# Patient Record
Sex: Male | Born: 1945 | Race: White | Hispanic: No | Marital: Single | Smoking: Current every day smoker
Health system: Southern US, Community
[De-identification: ages and names within clinical notes are randomized; demographics above are authoritative.]

## PROBLEM LIST (undated history)

## (undated) DIAGNOSIS — E7800 Pure hypercholesterolemia, unspecified: Secondary | ICD-10-CM

## (undated) DIAGNOSIS — J449 Chronic obstructive pulmonary disease, unspecified: Secondary | ICD-10-CM

## (undated) DIAGNOSIS — I10 Essential (primary) hypertension: Secondary | ICD-10-CM

## (undated) DIAGNOSIS — M549 Dorsalgia, unspecified: Secondary | ICD-10-CM

## (undated) HISTORY — PX: CATARACT EXTRACTION: SUR2

## (undated) HISTORY — PX: ROTATOR CUFF REPAIR: SHX139

---

## 2011-05-02 ENCOUNTER — Emergency Department (HOSPITAL_COMMUNITY): Payer: Self-pay

## 2011-05-02 ENCOUNTER — Emergency Department (HOSPITAL_COMMUNITY)
Admission: EM | Admit: 2011-05-02 | Discharge: 2011-05-02 | Disposition: A | Discharge status: Home / Self Care | Payer: Self-pay | Attending: Emergency Medicine | Admitting: Emergency Medicine

## 2011-05-02 ENCOUNTER — Encounter: Payer: Self-pay | Admitting: *Deleted

## 2011-05-02 ENCOUNTER — Other Ambulatory Visit: Payer: Self-pay

## 2011-05-02 DIAGNOSIS — F17200 Nicotine dependence, unspecified, uncomplicated: Secondary | ICD-10-CM | POA: Insufficient documentation

## 2011-05-02 DIAGNOSIS — R0789 Other chest pain: Secondary | ICD-10-CM | POA: Insufficient documentation

## 2011-05-02 DIAGNOSIS — E785 Hyperlipidemia, unspecified: Secondary | ICD-10-CM | POA: Insufficient documentation

## 2011-05-02 DIAGNOSIS — Z79899 Other long term (current) drug therapy: Secondary | ICD-10-CM | POA: Insufficient documentation

## 2011-05-02 DIAGNOSIS — R911 Solitary pulmonary nodule: Secondary | ICD-10-CM

## 2011-05-02 DIAGNOSIS — E789 Disorder of lipoprotein metabolism, unspecified: Secondary | ICD-10-CM | POA: Insufficient documentation

## 2011-05-02 DIAGNOSIS — R799 Abnormal finding of blood chemistry, unspecified: Secondary | ICD-10-CM | POA: Insufficient documentation

## 2011-05-02 DIAGNOSIS — R091 Pleurisy: Secondary | ICD-10-CM | POA: Insufficient documentation

## 2011-05-02 DIAGNOSIS — R059 Cough, unspecified: Secondary | ICD-10-CM | POA: Insufficient documentation

## 2011-05-02 DIAGNOSIS — I10 Essential (primary) hypertension: Secondary | ICD-10-CM | POA: Insufficient documentation

## 2011-05-02 DIAGNOSIS — R0602 Shortness of breath: Secondary | ICD-10-CM | POA: Insufficient documentation

## 2011-05-02 DIAGNOSIS — R05 Cough: Secondary | ICD-10-CM | POA: Insufficient documentation

## 2011-05-02 DIAGNOSIS — J984 Other disorders of lung: Secondary | ICD-10-CM | POA: Insufficient documentation

## 2011-05-02 HISTORY — DX: Essential (primary) hypertension: I10

## 2011-05-02 HISTORY — DX: Pure hypercholesterolemia, unspecified: E78.00

## 2011-05-02 LAB — COMPREHENSIVE METABOLIC PANEL
ALT: 15 U/L (ref 0–53)
AST: 16 U/L (ref 0–37)
Albumin: 3.7 g/dL (ref 3.5–5.2)
Alkaline Phosphatase: 60 U/L (ref 39–117)
CO2: 29 mEq/L (ref 19–32)
Chloride: 103 mEq/L (ref 96–112)
GFR calc non Af Amer: 86 mL/min — ABNORMAL LOW (ref >90)
Potassium: 4.7 mEq/L (ref 3.5–5.1)
Total Bilirubin: 0.4 mg/dL (ref 0.3–1.2)

## 2011-05-02 LAB — DIFFERENTIAL
Basophils Absolute: 0.1 10*3/uL (ref 0.0–0.1)
Basophils Relative: 1 % (ref 0–1)
Lymphocytes Relative: 20 % (ref 12–46)
Monocytes Relative: 9 % (ref 3–12)
Neutro Abs: 5.1 10*3/uL (ref 1.7–7.7)
Neutrophils Relative %: 64 % (ref 43–77)

## 2011-05-02 LAB — CARDIAC PANEL(CRET KIN+CKTOT+MB+TROPI): Relative Index: INVALID (ref 0.0–2.5)

## 2011-05-02 LAB — CBC
Hemoglobin: 14 g/dL (ref 13.0–17.0)
MCHC: 34.4 g/dL (ref 30.0–36.0)
RDW: 12.9 % (ref 11.5–15.5)
WBC: 8 10*3/uL (ref 4.0–10.5)

## 2011-05-02 MED ORDER — OXYCODONE-ACETAMINOPHEN 5-325 MG PO TABS: 1.0000 | ORAL_TABLET | Freq: Four times a day (QID) | ORAL | Status: AC | PRN

## 2011-05-02 MED ORDER — IOHEXOL 300 MG/ML  SOLN
100.0000 mL | Freq: Once | INTRAMUSCULAR | Status: AC | PRN
  Administered 2011-05-02: 100 mL via INTRAVENOUS

## 2011-05-02 MED ORDER — OXYCODONE-ACETAMINOPHEN 5-325 MG PO TABS
1.0000 | ORAL_TABLET | Freq: Once | ORAL | Status: AC
  Administered 2011-05-02: 1 via ORAL
  Filled 2011-05-02: qty 1

## 2011-05-02 NOTE — ED Notes (Signed)
Patient transported to CT 

## 2011-05-02 NOTE — ED Provider Notes (Addendum)
History     CSN: [id number]  Arrival date & time 05/02/11  1057   First MD Initiated Contact with Patient 05/02/11 1212      Chief Complaint  Patient presents with  . Chest Pain  . Cough    (Consider location/radiation/quality/duration/timing/severity/associated sxs/prior treatment) Patient is a 65 y.o. male presenting with chest pain and cough. The history is provided by the patient.  Chest Pain The chest pain began 3 - 5 days ago. Duration of episode(s) is 4 days. Chest pain occurs constantly. The chest pain is worsening. The pain is associated with breathing and coughing. At its most intense, the pain is at 8/10. The pain is currently at 8/10. The severity of the pain is severe. The quality of the pain is described as pleuritic, sharp, tightness and stabbing. The pain does not radiate. Chest pain is worsened by deep breathing. Primary symptoms include shortness of breath and cough. Pertinent negatives for primary symptoms include no fever, no wheezing, no palpitations, no abdominal pain, no nausea and no vomiting.  Pertinent negatives for associated symptoms include no diaphoresis, no lower extremity edema and no weakness. Associated symptoms comments: States that his appetite has been decreased over the last few days. He tried narcotics (He had some Tylenol 3 that he took which helped the pain some) for the symptoms. Risk factors include male gender and smoking/tobacco exposure.  His past medical history is significant for hyperlipidemia and hypertension.  Pertinent negatives for past medical history include no CAD and no diabetes.  Procedure history is negative for stress echo.    Cough Associated symptoms include chest pain and shortness of breath. Pertinent negatives include no wheezing.    Past Medical History  Diagnosis Date  . Hypertension   . High cholesterol     History reviewed. No pertinent past surgical history.  History reviewed. No pertinent family  history.  History  Substance Use Topics  . Smoking status: Current Everyday Smoker    Types: Cigarettes  . Smokeless tobacco: Not on file  . Alcohol Use: No      Review of Systems  Constitutional: Negative for fever and diaphoresis.  Respiratory: Positive for cough and shortness of breath. Negative for wheezing.   Cardiovascular: Positive for chest pain. Negative for palpitations.  Gastrointestinal: Negative for nausea, vomiting and abdominal pain.  Neurological: Negative for weakness.  All other systems reviewed and are negative.    Allergies  Review of patient's allergies indicates no known allergies.  Home Medications   Current Outpatient Rx  Name Route Sig Dispense Refill  . ACETAMINOPHEN-CODEINE #3 300-30 MG PO TABS Oral Take 2 tablets by mouth 3 (three) times daily as needed. For pain.     Marland Kitchen CARVEDILOL 25 MG PO TABS Oral Take 25 mg by mouth 2 (two) times daily with a meal.      . GABAPENTIN 400 MG PO CAPS Oral Take 400 mg by mouth 3 (three) times daily.      Marland Kitchen PSEUDOEPHEDRINE HCL 60 MG PO TABS Oral Take 60 mg by mouth every 6 (six) hours as needed. For sinus pressure.     Marland Kitchen SIMVASTATIN 40 MG PO TABS Oral Take 40 mg by mouth at bedtime.      . TRAZODONE HCL 100 MG PO TABS Oral Take 150 mg by mouth at bedtime.        BP 167/73  Pulse 60  Temp(Src) 98.1 F (36.7 C) (Oral)  Resp 16  SpO2 100%  Physical Exam  Nursing  note and vitals reviewed. Constitutional: He is oriented to person, place, and time. He appears well-developed and well-nourished. No distress.  HENT:  Head: Normocephalic and atraumatic.  Mouth/Throat: Oropharynx is clear and moist.  Eyes: Conjunctivae and EOM are normal. Pupils are equal, round, and reactive to light.  Neck: Normal range of motion. Neck supple.  Cardiovascular: Normal rate, regular rhythm and intact distal pulses.   No murmur heard. Pulmonary/Chest: Effort normal and breath sounds normal. Not tachypneic. No respiratory distress.  He has no decreased breath sounds. He has no wheezes. He has no rales. He exhibits tenderness and bony tenderness. He exhibits no crepitus, no deformity and no swelling.    Abdominal: Soft. He exhibits no distension. There is no tenderness. There is no rebound and no guarding.  Musculoskeletal: Normal range of motion. He exhibits no edema and no tenderness.  Neurological: He is alert and oriented to person, place, and time.  Skin: Skin is warm and dry. No rash noted. No erythema.  Psychiatric: He has a normal mood and affect. His behavior is normal.    ED Course  Procedures (including critical care time)  Labs Reviewed  CBC - Abnormal; Notable for the following:    RBC 4.20 (*)    All other components within normal limits  DIFFERENTIAL - Abnormal; Notable for the following:    Eosinophils Relative 6 (*)    All other components within normal limits  COMPREHENSIVE METABOLIC PANEL - Abnormal; Notable for the following:    GFR calc non Af Amer 86 (*)    All other components within normal limits  D-DIMER, QUANTITATIVE - Abnormal; Notable for the following:    D-Dimer, Quant 0.84 (*)    All other components within normal limits  CARDIAC PANEL(CRET KIN+CKTOT+MB+TROPI)   No results found.   Date: 05/02/2011  Rate: 56  Rhythm: sinus bradycardia  QRS Axis: normal  Intervals: normal  ST/T Wave abnormalities: early repolarization  Conduction Disutrbances:none  Narrative Interpretation:   Old EKG Reviewed: none available    No diagnosis found.    MDM   Pt with atypical story for CP.  Symptoms are mostly pleuritic and they started before the cough. Denies any infectious symptoms other than her cough.  He has no known cardiac history but does have a history of hypertension and hyperlipidemia.  TIMI 1 for risk factors.  Low risk well's.  Concern for possible spontaneous pneumothorax versus infectious etiology versus cardiac versus PE. EKG sinus bradycardia.  CXR, CBC, CMP, CE,  d-dimer pending. 1:28 PM Labs all within normal limits except for an elevated d-dimer. Chest x-ray within normal limits. However due to the elevated d-dimer we will get a CT of the chest to further evaluate. A very low likelihood that this is cardiac especially given negative cardiac markers and symptoms for over 48 hours. Move to CDU for CT.  4:24 PM CT negative for PE. Pulmonary nodule is seen and patient is a smoker so discussed with him followup for repeat imaging in 6-12 months. Discussed results with patient may be pleurisy versus unknown cause for pain however do not feel that it is cardiac or respiratory in nature. Will discharge and have him follow up at the [place] on Wednesday as planned.      Gwyneth Sprout, MD 05/02/11 1329  Gwyneth Sprout, MD 05/02/11 [phone]

## 2011-05-02 NOTE — ED Notes (Signed)
Reports left side stabbing constant chest pains since Friday. Increases with breathing and having productive cough. No distress noted at triage, ekg done.

## 2015-01-09 ENCOUNTER — Encounter: Payer: Self-pay | Admitting: Emergency Medicine

## 2015-01-09 ENCOUNTER — Emergency Department
Admission: EM | Admit: 2015-01-09 | Discharge: 2015-01-09 | Disposition: A | Discharge status: Home / Self Care | Payer: Medicare Other | Attending: Emergency Medicine | Admitting: Emergency Medicine

## 2015-01-09 DIAGNOSIS — M5432 Sciatica, left side: Secondary | ICD-10-CM

## 2015-01-09 DIAGNOSIS — M5442 Lumbago with sciatica, left side: Secondary | ICD-10-CM | POA: Insufficient documentation

## 2015-01-09 DIAGNOSIS — Z791 Long term (current) use of non-steroidal anti-inflammatories (NSAID): Secondary | ICD-10-CM | POA: Insufficient documentation

## 2015-01-09 DIAGNOSIS — G8929 Other chronic pain: Secondary | ICD-10-CM | POA: Insufficient documentation

## 2015-01-09 DIAGNOSIS — R202 Paresthesia of skin: Secondary | ICD-10-CM | POA: Insufficient documentation

## 2015-01-09 DIAGNOSIS — Z720 Tobacco use: Secondary | ICD-10-CM | POA: Diagnosis not present

## 2015-01-09 DIAGNOSIS — M545 Low back pain: Secondary | ICD-10-CM

## 2015-01-09 DIAGNOSIS — Z7951 Long term (current) use of inhaled steroids: Secondary | ICD-10-CM | POA: Insufficient documentation

## 2015-01-09 DIAGNOSIS — Z79899 Other long term (current) drug therapy: Secondary | ICD-10-CM | POA: Diagnosis not present

## 2015-01-09 DIAGNOSIS — I10 Essential (primary) hypertension: Secondary | ICD-10-CM | POA: Insufficient documentation

## 2015-01-09 HISTORY — DX: Dorsalgia, unspecified: M54.9

## 2015-01-09 HISTORY — DX: Chronic obstructive pulmonary disease, unspecified: J44.9

## 2015-01-09 HISTORY — DX: Pure hypercholesterolemia, unspecified: E78.00

## 2015-01-09 MED ORDER — HYDROCODONE-ACETAMINOPHEN 5-325 MG PO TABS: 1.0000 | ORAL_TABLET | ORAL | Status: DC | PRN

## 2015-01-09 MED ORDER — PREDNISONE 10 MG PO TABS: ORAL_TABLET | ORAL | Status: DC

## 2015-01-09 NOTE — ED Notes (Signed)
Lower back pain with pain radiating into left leg since tuesday

## 2015-01-09 NOTE — ED Provider Notes (Signed)
[facility] Emergency Department Provider Note  ____________________________________________  Time seen: Approximately 7:41 AM  I have reviewed the triage vital signs and the nursing notes.   HISTORY  Chief Complaint Back Pain   HPI Ross Fisher is a 69 y.o. male is here today with complaint of back pain with radiation into his left leg for approximately 3 days. He states he has had a history of chronic back pain is being seen at the [facility]. He states that he was seen at the [facility] emergency room last month and has an MRI scheduled for the end of this month. He denies any urinary or bowel incontinence. He states that while in the emergency room at the [facility] last month he was given Vicodin for pain. Currently his pain is 8/10. Movement increases his pain and nothing seems to help. He denies any history of urinary tract infections or kidney stones   Past Medical History  Diagnosis Date  . Hypertension   . Back pain   . COPD (chronic obstructive pulmonary disease)   . Hypercholesteremia     There are no active problems to display for this patient.   Past Surgical History  Procedure Laterality Date  . Cataract extraction    . Rotator cuff repair      bilat    Current Outpatient Rx  Name  Route  Sig  Dispense  Refill  . albuterol (PROVENTIL HFA;VENTOLIN HFA) 108 (90 BASE) MCG/ACT inhaler   Inhalation   Inhale into the lungs every 6 (six) hours as needed for wheezing or shortness of breath.         Marland Kitchen amLODipine (NORVASC) 10 MG tablet   Oral   Take 10 mg by mouth daily.         . budesonide-formoterol (SYMBICORT) 160-4.5 MCG/ACT inhaler   Inhalation   Inhale 2 puffs into the lungs 2 (two) times daily.         . carvedilol (COREG) 12.5 MG tablet   Oral   Take 12.5 mg by mouth 2 (two) times daily with a meal.         . cetirizine (ZYRTEC) 10 MG tablet   Oral   Take 10 mg by mouth daily.         Marland Kitchen gabapentin (NEURONTIN) 400 MG  capsule   Oral   Take 400 mg by mouth 3 (three) times daily.         . methocarbamol (ROBAXIN) 500 MG tablet   Oral   Take 500 mg by mouth 4 (four) times daily.         . naproxen (NAPROSYN) 250 MG tablet   Oral   Take by mouth 2 (two) times daily with a meal.         . simvastatin (ZOCOR) 20 MG tablet   Oral   Take 20 mg by mouth daily.         . traZODone (DESYREL) 150 MG tablet   Oral   Take by mouth at bedtime.         Marland Kitchen HYDROcodone-acetaminophen (NORCO/VICODIN) 5-325 MG per tablet   Oral   Take 1 tablet by mouth every 4 (four) hours as needed for moderate pain.   20 tablet   0   . predniSONE (DELTASONE) 10 MG tablet      Take 6 tablets  today, on day 2 take 5 tablets, day 3 take 4 tablets, day 4 take 3 tablets, day 5 take  2 tablets  and 1 tablet the last day   21 tablet   0     Allergies Review of patient's allergies indicates no known allergies.  History reviewed. No pertinent family history.  Social History Social History  Substance Use Topics  . Smoking status: Current Every Day Smoker  . Smokeless tobacco: None  . Alcohol Use: No    Review of Systems Constitutional: No fever/chills Eyes: No visual changes. ENT: No sore throat. Cardiovascular: Denies chest pain. Respiratory: Denies shortness of breath. Gastrointestinal: No abdominal pain.  No nausea, no vomiting.   Genitourinary: Negative for dysuria. Musculoskeletal: Positive for chronic back pain Skin: Negative for rash. Neurological: Negative for headaches, focal weakness. Left leg paresthesias  10-point ROS otherwise negative.  ____________________________________________   PHYSICAL EXAM:  VITAL SIGNS: ED Triage Vitals  Enc Vitals Group     BP --      Pulse --      Resp --      Temp --      Temp src --      SpO2 --      Weight --      Height --      Head Cir --      Peak Flow --      Pain Score 01/09/15 0726 8     Pain Loc --      Pain Edu? --      Excl. in GC? --      Constitutional: Alert and oriented. Well appearing and in no acute distress. Eyes: Conjunctivae are normal. PERRL. EOMI. Head: Atraumatic. Nose: No congestion/rhinnorhea. Neck: No stridor.   Cardiovascular: Normal rate, regular rhythm. Grossly normal heart sounds.  Good peripheral circulation. Respiratory: Normal respiratory effort.  No retractions. Lungs CTAB. Gastrointestinal: Soft and nontender. No distention,  bowel sounds normoactive 4 quadrants Musculoskeletal: Back exam no deformity was noted. There is moderate tenderness on palpation of the left SI joint area which reproduces patient's pain. A leg raises at 90 with discomfort. She is ambulatory with a cane. No lower extremity tenderness nor edema.  No joint effusions. Neurologic:  Normal speech and language. No gross focal neurologic deficits are appreciated. No gait instability. Reflexes are 2+ bilaterally Skin:  Skin is warm, dry and intact. No rash noted. Psychiatric: Mood and affect are normal. Speech and behavior are normal.  ____________________________________________   LABS (all labs ordered are listed, but only abnormal results are displayed)  Labs Reviewed - No data to display  PROCEDURES  Procedure(s) performed: None  Critical Care performed: No  ____________________________________________   INITIAL IMPRESSION / ASSESSMENT AND PLAN / ED COURSE  Pertinent labs & imaging results that were available during my care of the patient were reviewed by me and considered in my medical decision making (see chart for details).  Patient drove himself to the emergency room today therefore he was not given any medication while in the emergency room. He was started on a prednisone taper and also given a limited quantity of hydrocodone No. 20. Patient is to keep his appointment for his MRI ____________________________________________   FINAL CLINICAL IMPRESSION(S) / ED DIAGNOSES  Final diagnoses:  Sciatica, left   Chronic low back pain      Tommi Rumps, PA-C 01/09/15 1610  Sharyn Creamer, MD 01/10/15 [phone]

## 2015-01-09 NOTE — ED Notes (Signed)
Reports chronic back pain, worse since Tuesday.  Skin w/d, MAE

## 2015-01-12 ENCOUNTER — Encounter: Payer: Self-pay | Admitting: *Deleted

## 2015-02-16 ENCOUNTER — Emergency Department
Admission: EM | Admit: 2015-02-16 | Discharge: 2015-02-16 | Disposition: A | Discharge status: Home / Self Care | Payer: Medicare Other | Attending: Emergency Medicine | Admitting: Emergency Medicine

## 2015-02-16 ENCOUNTER — Encounter: Payer: Self-pay | Admitting: Emergency Medicine

## 2015-02-16 DIAGNOSIS — Z79899 Other long term (current) drug therapy: Secondary | ICD-10-CM | POA: Insufficient documentation

## 2015-02-16 DIAGNOSIS — M5432 Sciatica, left side: Secondary | ICD-10-CM | POA: Diagnosis not present

## 2015-02-16 DIAGNOSIS — I10 Essential (primary) hypertension: Secondary | ICD-10-CM | POA: Insufficient documentation

## 2015-02-16 DIAGNOSIS — Z720 Tobacco use: Secondary | ICD-10-CM | POA: Insufficient documentation

## 2015-02-16 DIAGNOSIS — Z7951 Long term (current) use of inhaled steroids: Secondary | ICD-10-CM | POA: Insufficient documentation

## 2015-02-16 DIAGNOSIS — Z791 Long term (current) use of non-steroidal anti-inflammatories (NSAID): Secondary | ICD-10-CM | POA: Insufficient documentation

## 2015-02-16 DIAGNOSIS — M545 Low back pain: Secondary | ICD-10-CM | POA: Diagnosis present

## 2015-02-16 MED ORDER — HYDROCODONE-ACETAMINOPHEN 5-325 MG PO TABS: 1.0000 | ORAL_TABLET | ORAL | Status: DC | PRN

## 2015-02-16 MED ORDER — PREDNISONE 10 MG PO TABS: 10.0000 mg | ORAL_TABLET | ORAL | Status: DC

## 2015-02-16 NOTE — ED Notes (Addendum)
Having lower back pain  Denies any injury was seen for same about 6 weeks ago.Marland Kitchen.ambulates slowly to room. Was placed on prednisone taper .  felt relief after taking the meds. states pain started to return about 2-3 weeks ago

## 2015-02-16 NOTE — ED Provider Notes (Signed)
[facility] Emergency Department Provider Note  ____________________________________________  Time seen: Approximately 6:05 PM  I have reviewed the triage vital signs and the nursing notes.   HISTORY  Chief Complaint Tailbone Pain    HPI Ross Fisher is a 69 y.o. male who presents to the emergency department complaining of lower back pain radiating into his left leg. He states that he was seen for same symptoms approximately 6-7 weeks ago was diagnosed with sciatica. He is placed on prednisone taper for same and states that he had good relief from symptoms. He reports that symptoms have started a week ago and then increase last night. He has had an MRI of his lower back through the [place] system states that he does not have results yet. He endorses the pain as a fiery sensation, constant, worse with movement.   Past Medical History  Diagnosis Date  . High cholesterol   . Hypertension   . Back pain   . COPD (chronic obstructive pulmonary disease) (HCC)   . Hypercholesteremia     There are no active problems to display for this patient.   Past Surgical History  Procedure Laterality Date  . Cataract extraction    . Rotator cuff repair      bilat    Current Outpatient Rx  Name  Route  Sig  Dispense  Refill  . acetaminophen-codeine (TYLENOL #3) 300-30 MG per tablet   Oral   Take 2 tablets by mouth 3 (three) times daily as needed. For pain.          Marland Kitchen albuterol (PROVENTIL HFA;VENTOLIN HFA) 108 (90 BASE) MCG/ACT inhaler   Inhalation   Inhale into the lungs every 6 (six) hours as needed for wheezing or shortness of breath.         Marland Kitchen amLODipine (NORVASC) 10 MG tablet   Oral   Take 10 mg by mouth daily.         . budesonide-formoterol (SYMBICORT) 160-4.5 MCG/ACT inhaler   Inhalation   Inhale 2 puffs into the lungs 2 (two) times daily.         . carvedilol (COREG) 12.5 MG tablet   Oral   Take 12.5 mg by mouth 2 (two) times daily with a  meal.         . carvedilol (COREG) 25 MG tablet   Oral   Take 25 mg by mouth 2 (two) times daily with a meal.           . cetirizine (ZYRTEC) 10 MG tablet   Oral   Take 10 mg by mouth daily.         Marland Kitchen gabapentin (NEURONTIN) 400 MG capsule   Oral   Take 400 mg by mouth 3 (three) times daily.           Marland Kitchen gabapentin (NEURONTIN) 400 MG capsule   Oral   Take 400 mg by mouth 3 (three) times daily.         Marland Kitchen HYDROcodone-acetaminophen (NORCO/VICODIN) 5-325 MG tablet   Oral   Take 1 tablet by mouth every 4 (four) hours as needed for moderate pain.   20 tablet   0   . methocarbamol (ROBAXIN) 500 MG tablet   Oral   Take 500 mg by mouth 4 (four) times daily.         . naproxen (NAPROSYN) 250 MG tablet   Oral   Take by mouth 2 (two) times daily with a meal.         .  predniSONE (DELTASONE) 10 MG tablet   Oral   Take 1 tablet (10 mg total) by mouth as directed.   21 tablet   0     Take on a daily basis of 6, 5, 4, 3, 2, 1   . pseudoephedrine (SUDAFED) 60 MG tablet   Oral   Take 60 mg by mouth every 6 (six) hours as needed. For sinus pressure.          . simvastatin (ZOCOR) 20 MG tablet   Oral   Take 20 mg by mouth daily.         . simvastatin (ZOCOR) 40 MG tablet   Oral   Take 40 mg by mouth at bedtime.           . traZODone (DESYREL) 100 MG tablet   Oral   Take 150 mg by mouth at bedtime.           . traZODone (DESYREL) 150 MG tablet   Oral   Take by mouth at bedtime.           Allergies Review of patient's allergies indicates no known allergies.  No family history on file.  Social History Social History  Substance Use Topics  . Smoking status: Current Every Day Smoker    Types: Cigarettes  . Smokeless tobacco: None  . Alcohol Use: No    Review of Systems Constitutional: No fever/chills Eyes: No visual changes. ENT: No sore throat. Cardiovascular: Denies chest pain. Respiratory: Denies shortness of breath. Gastrointestinal:  No abdominal pain.  No nausea, no vomiting.  No diarrhea.  No constipation. Genitourinary: Negative for dysuria. Musculoskeletal: Endorses low back pain. Skin: Negative for rash. Neurological: Negative for headaches, focal weakness or numbness.  10-point ROS otherwise negative.  ____________________________________________   PHYSICAL EXAM:  VITAL SIGNS: ED Triage Vitals  Enc Vitals Group     BP 02/16/15 1612 127/65 mmHg     Pulse Rate 02/16/15 1612 66     Resp 02/16/15 1612 8     Temp 02/16/15 1612 98.3 F (36.8 C)     Temp Source 02/16/15 1612 Oral     SpO2 02/16/15 1612 99 %     Weight 02/16/15 1612 145 lb (65.772 kg)     Height 02/16/15 1612 5\' 8"  (1.727 m)     Head Cir --      Peak Flow --      Pain Score 02/16/15 1612 8     Pain Loc --      Pain Edu? --      Excl. in GC? --     Constitutional: Alert and oriented. Well appearing and in no acute distress. Eyes: Conjunctivae are normal. PERRL. EOMI. Head: Atraumatic. Nose: No congestion/rhinnorhea. Mouth/Throat: Mucous membranes are moist.  Oropharynx non-erythematous. Neck: No stridor.   Cardiovascular: Normal rate, regular rhythm. Grossly normal heart sounds.  Good peripheral circulation. Respiratory: Normal respiratory effort.  No retractions. Lungs CTAB. Gastrointestinal: Soft and nontender. No distention. No abdominal bruits. No CVA tenderness. Musculoskeletal: No lower extremity tenderness nor edema.  No joint effusions. Tenderness to palpation over left lateral back over the sciatic notch. Straight leg raise is positive on the left side. Sensation and pulses intact distally. No findings on right side. Neurologic:  Normal speech and language. No gross focal neurologic deficits are appreciated. No gait instability. Skin:  Skin is warm, dry and intact. No rash noted. Psychiatric: Mood and affect are normal. Speech and behavior are normal.  ____________________________________________   LABS (  all labs ordered  are listed, but only abnormal results are displayed)  Labs Reviewed - No data to display ____________________________________________  EKG   ____________________________________________  RADIOLOGY   ____________________________________________   PROCEDURES  Procedure(s) performed: None  Critical Care performed: No  ____________________________________________   INITIAL IMPRESSION / ASSESSMENT AND PLAN / ED COURSE  Pertinent labs & imaging results that were available during my care of the patient were reviewed by me and considered in my medical decision making (see chart for details).  The patient's history, symptoms, physical exam are consistent with sciatica. I advised the patient of findings and diagnosis. I discussed treatment options with the patient and he request having a prednisone taper since "I know that works for me." [facility] for further follow-up and evaluation. I advised the patient to take medications as prescribed. Patient is to return to the [place] for results or return to the emergency department for worsening of his symptoms. Patient verbalizes understanding and compliance with same. ____________________________________________   FINAL CLINICAL IMPRESSION(S) / ED DIAGNOSES  Final diagnoses:  Sciatica of left side      Racheal Patches, PA-C 02/16/15 1822  Myrna Blazer, MD 02/16/15 2158

## 2015-02-16 NOTE — ED Notes (Signed)
Pt with low back tailbone pain, seen here 5-6 weeks ago for same thing and dx with sciatica. No acute distress noted.

## 2015-02-16 NOTE — Discharge Instructions (Signed)
Back Exercises °The following exercises strengthen the muscles that help to support the back. They also help to keep the lower back flexible. Doing these exercises can help to prevent back pain or lessen existing pain. °If you have back pain or discomfort, try doing these exercises 2-3 times each day or as told by your health care provider. When the pain goes away, do them once each day, but increase the number of times that you repeat the steps for each exercise (do more repetitions). If you do not have back pain or discomfort, do these exercises once each day or as told by your health care provider. °EXERCISES °Single Knee to Chest °Repeat these steps 3-5 times for each leg: °· Lie on your back on a firm bed or the floor with your legs extended. °· Bring one knee to your chest. Your other leg should stay extended and in contact with the floor. °· Hold your knee in place by grabbing your knee or thigh. °· Pull on your knee until you feel a gentle stretch in your lower back. °· Hold the stretch for 10-30 seconds. °· Slowly release and straighten your leg. °Pelvic Tilt °Repeat these steps 5-10 times: °· Lie on your back on a firm bed or the floor with your legs extended. °· Bend your knees so they are pointing toward the ceiling and your feet are flat on the floor. °· Tighten your lower abdominal muscles to press your lower back against the floor. This motion will tilt your pelvis so your tailbone points up toward the ceiling instead of pointing to your feet or the floor. °· With gentle tension and even breathing, hold this position for 5-10 seconds. °Cat-Cow °Repeat these steps until your lower back becomes more flexible: °· Get into a hands-and-knees position on a firm surface. Keep your hands under your shoulders, and keep your knees under your hips. You may place padding under your knees for comfort. °· Let your head hang down, and point your tailbone toward the floor so your lower back becomes rounded like the  back of a cat. °· Hold this position for 5 seconds. °· Slowly lift your head and point your tailbone up toward the ceiling so your back forms a sagging arch like the back of a cow. °· Hold this position for 5 seconds. °Press-Ups °Repeat these steps 5-10 times: °· Lie on your abdomen (face-down) on the floor. °· Place your palms near your head, about shoulder-width apart. °· While you keep your back as relaxed as possible and keep your hips on the floor, slowly straighten your arms to raise the top half of your body and lift your shoulders. Do not use your back muscles to raise your upper torso. You may adjust the placement of your hands to make yourself more comfortable. °· Hold this position for 5 seconds while you keep your back relaxed. °· Slowly return to lying flat on the floor. °Bridges °Repeat these steps 10 times: °· Lie on your back on a firm surface. °· Bend your knees so they are pointing toward the ceiling and your feet are flat on the floor. °· Tighten your buttocks muscles and lift your buttocks off of the floor until your waist is at almost the same height as your knees. You should feel the muscles working in your buttocks and the back of your thighs. If you do not feel these muscles, slide your feet 1-2 inches farther away from your buttocks. °· Hold this position for 3-5   seconds. °· Slowly lower your hips to the starting position, and allow your buttocks muscles to relax completely. °If this exercise is too easy, try doing it with your arms crossed over your chest. °Abdominal Crunches °Repeat these steps 5-10 times: °· Lie on your back on a firm bed or the floor with your legs extended. °· Bend your knees so they are pointing toward the ceiling and your feet are flat on the floor. °· Cross your arms over your chest. °· Tip your chin slightly toward your chest without bending your neck. °· Tighten your abdominal muscles and slowly raise your trunk (torso) high enough to lift your shoulder blades a  tiny bit off of the floor. Avoid raising your torso higher than that, because it can put too much stress on your low back and it does not help to strengthen your abdominal muscles. °· Slowly return to your starting position. °Back Lifts °Repeat these steps 5-10 times: °· Lie on your abdomen (face-down) with your arms at your sides, and rest your forehead on the floor. °· Tighten the muscles in your legs and your buttocks. °· Slowly lift your chest off of the floor while you keep your hips pressed to the floor. Keep the back of your head in line with the curve in your back. Your eyes should be looking at the floor. °· Hold this position for 3-5 seconds. °· Slowly return to your starting position. °SEEK MEDICAL CARE IF: °· Your back pain or discomfort gets much worse when you do an exercise. °· Your back pain or discomfort does not lessen within 2 hours after you exercise. °If you have any of these problems, stop doing these exercises right away. Do not do them again unless your health care provider says that you can. °SEEK IMMEDIATE MEDICAL CARE IF: °· You develop sudden, severe back pain. If this happens, stop doing the exercises right away. Do not do them again unless your health care provider says that you can. °  °This information is not intended to replace advice given to you by your health care provider. Make sure you discuss any questions you have with your health care provider. °  °Document Released: 05/26/2004 Document Revised: 01/07/2015 Document Reviewed: 06/12/2014 °Elsevier Interactive Patient Education ©2016 [employer] ° °Radicular Pain °Radicular pain in either the arm or leg is usually from a bulging or herniated disk in the spine. A piece of the herniated disk may press against the nerves as the nerves exit the spine. This causes pain which is felt at the tips of the nerves down the arm or leg. Other causes of radicular pain may include: °· Fractures. °· Heart disease. °· Cancer. °· An abnormal  and usually degenerative state of the nervous system or nerves (neuropathy). °Diagnosis may require CT or MRI scanning to determine the primary cause.  °Nerves that start at the neck (nerve roots) may cause radicular pain in the outer shoulder and arm. It can spread down to the thumb and fingers. The symptoms vary depending on which nerve root has been affected. In most cases radicular pain improves with conservative treatment. Neck problems may require physical therapy, a neck collar, or cervical traction. Treatment may take many weeks, and surgery may be considered if the symptoms do not improve.  °Conservative treatment is also recommended for sciatica. Sciatica causes pain to radiate from the lower back or buttock area down the leg into the foot. Often there is a history of back problems. Most patients with   sciatica are better after 2 to 4 weeks of rest and other supportive care. Short term bed rest can reduce the disk pressure considerably. Sitting, however, is not a good position since this increases the pressure on the disk. You should avoid bending, lifting, and all other activities which make the problem worse. Traction can be used in severe cases. Surgery is usually reserved for patients who do not improve within the first months of treatment. °Only take over-the-counter or prescription medicines for pain, discomfort, or fever as directed by your caregiver. Narcotics and muscle relaxants may help by relieving more severe pain and spasm and by providing mild sedation. Cold or massage can give significant relief. Spinal manipulation is not recommended. It can increase the degree of disc protrusion. Epidural steroid injections are often effective treatment for radicular pain. These injections deliver medicine to the spinal nerve in the space between the protective covering of the spinal cord and back bones (vertebrae). Your caregiver can give you more information about steroid injections. These injections are  most effective when given within two weeks of the onset of pain.  °You should see your caregiver for follow up care as recommended. A program for neck and back injury rehabilitation with stretching and strengthening exercises is an important part of management.  °SEEK IMMEDIATE MEDICAL CARE IF: °· You develop increased pain, weakness, or numbness in your arm or leg. °· You develop difficulty with bladder or bowel control. °· You develop abdominal pain. °  °This information is not intended to replace advice given to you by your health care provider. Make sure you discuss any questions you have with your health care provider. °  °Document Released: 05/26/2004 Document Revised: 05/09/2014 Document Reviewed: 11/12/2014 °Elsevier Interactive Patient Education ©2016 [employer] ° °Sciatica °Sciatica is pain, weakness, numbness, or tingling along the path of the sciatic nerve. The nerve starts in the lower back and runs down the back of each leg. The nerve controls the muscles in the lower leg and in the back of the knee, while also providing sensation to the back of the thigh, lower leg, and the sole of your foot. Sciatica is a symptom of another medical condition. For instance, nerve damage or certain conditions, such as a herniated disk or bone spur on the spine, pinch or put pressure on the sciatic nerve. This causes the pain, weakness, or other sensations normally associated with sciatica. Generally, sciatica only affects one side of the body. °CAUSES  °· Herniated or slipped disc. °· Degenerative disk disease. °· A pain disorder involving the narrow muscle in the buttocks (piriformis syndrome). °· Pelvic injury or fracture. °· Pregnancy. °· Tumor (rare). °SYMPTOMS  °Symptoms can vary from mild to very severe. The symptoms usually travel from the low back to the buttocks and down the back of the leg. Symptoms can include: °· Mild tingling or dull aches in the lower back, leg, or hip. °· Numbness in the back of the  calf or sole of the foot. °· Burning sensations in the lower back, leg, or hip. °· Sharp pains in the lower back, leg, or hip. °· Leg weakness. °· Severe back pain inhibiting movement. °These symptoms may get worse with coughing, sneezing, laughing, or prolonged sitting or standing. Also, being overweight may worsen symptoms. °DIAGNOSIS  °Your caregiver will perform a physical exam to look for common symptoms of sciatica. He or she may ask you to do certain movements or activities that would trigger sciatic nerve pain. Other tests may   be performed to find the cause of the sciatica. These may include: °· Blood tests. °· X-rays. °· Imaging tests, such as an MRI or CT scan. °TREATMENT  °Treatment is directed at the cause of the sciatic pain. Sometimes, treatment is not necessary and the pain and discomfort goes away on its own. If treatment is needed, your caregiver may suggest: °· Over-the-counter medicines to relieve pain. °· Prescription medicines, such as anti-inflammatory medicine, muscle relaxants, or narcotics. °· Applying heat or ice to the painful area. °· Steroid injections to lessen pain, irritation, and inflammation around the nerve. °· Reducing activity during periods of pain. °· Exercising and stretching to strengthen your abdomen and improve flexibility of your spine. Your caregiver may suggest losing weight if the extra weight makes the back pain worse. °· Physical therapy. °· Surgery to eliminate what is pressing or pinching the nerve, such as a bone spur or part of a herniated disk. °HOME CARE INSTRUCTIONS  °· Only take over-the-counter or prescription medicines for pain or discomfort as directed by your caregiver. °· Apply ice to the affected area for 20 minutes, 3-4 times a day for the first 48-72 hours. Then try heat in the same way. °· Exercise, stretch, or perform your usual activities if these do not aggravate your pain. °· Attend physical therapy sessions as directed by your caregiver. °· Keep  all follow-up appointments as directed by your caregiver. °· Do not wear high heels or shoes that do not provide proper support. °· Check your mattress to see if it is too soft. A firm mattress may lessen your pain and discomfort. °SEEK IMMEDIATE MEDICAL CARE IF:  °· You lose control of your bowel or bladder (incontinence). °· You have increasing weakness in the lower back, pelvis, buttocks, or legs. °· You have redness or swelling of your back. °· You have a burning sensation when you urinate. °· You have pain that gets worse when you lie down or awakens you at night. °· Your pain is worse than you have experienced in the past. °· Your pain is lasting longer than 4 weeks. °· You are suddenly losing weight without reason. °MAKE SURE YOU: °· Understand these instructions. °· Will watch your condition. °· Will get help right away if you are not doing well or get worse. °  °This information is not intended to replace advice given to you by your health care provider. Make sure you discuss any questions you have with your health care provider. °  °Document Released: 04/12/2001 Document Revised: 01/07/2015 Document Reviewed: 08/28/2011 °Elsevier Interactive Patient Education ©2016 [employer] ° °

## 2015-09-03 ENCOUNTER — Emergency Department
Admission: EM | Admit: 2015-09-03 | Discharge: 2015-09-03 | Disposition: A | Discharge status: Home / Self Care | Payer: Medicare Other | Attending: Emergency Medicine | Admitting: Emergency Medicine

## 2015-09-03 DIAGNOSIS — J449 Chronic obstructive pulmonary disease, unspecified: Secondary | ICD-10-CM | POA: Diagnosis not present

## 2015-09-03 DIAGNOSIS — Z79899 Other long term (current) drug therapy: Secondary | ICD-10-CM | POA: Diagnosis not present

## 2015-09-03 DIAGNOSIS — K4090 Unilateral inguinal hernia, without obstruction or gangrene, not specified as recurrent: Secondary | ICD-10-CM | POA: Diagnosis not present

## 2015-09-03 DIAGNOSIS — F17210 Nicotine dependence, cigarettes, uncomplicated: Secondary | ICD-10-CM | POA: Diagnosis not present

## 2015-09-03 DIAGNOSIS — I10 Essential (primary) hypertension: Secondary | ICD-10-CM | POA: Diagnosis not present

## 2015-09-03 LAB — LIPASE, BLOOD: LIPASE: 11 U/L (ref 11–51)

## 2015-09-03 LAB — URINALYSIS COMPLETE WITH MICROSCOPIC (ARMC ONLY)
Bacteria, UA: NONE SEEN
Glucose, UA: NEGATIVE mg/dL
HGB URINE DIPSTICK: NEGATIVE
Ketones, ur: NEGATIVE mg/dL
Leukocytes, UA: NEGATIVE
Nitrite: NEGATIVE
PH: 5 (ref 5.0–8.0)
PROTEIN: NEGATIVE mg/dL
SQUAMOUS EPITHELIAL / LPF: NONE SEEN
Specific Gravity, Urine: 1.019 (ref 1.005–1.030)

## 2015-09-03 LAB — CBC
HCT: 47.9 % (ref 40.0–52.0)
HEMOGLOBIN: 16.1 g/dL (ref 13.0–18.0)
MCH: 32 pg (ref 26.0–34.0)
MCHC: 33.6 g/dL (ref 32.0–36.0)
MCV: 95.4 fL (ref 80.0–100.0)
PLATELETS: 226 10*3/uL (ref 150–440)
RBC: 5.02 MIL/uL (ref 4.40–5.90)
RDW: 14.4 % (ref 11.5–14.5)
WBC: 8.7 10*3/uL (ref 3.8–10.6)

## 2015-09-03 LAB — COMPREHENSIVE METABOLIC PANEL
ALBUMIN: 4.6 g/dL (ref 3.5–5.0)
ALT: 19 U/L (ref 17–63)
AST: 18 U/L (ref 15–41)
Alkaline Phosphatase: 48 U/L (ref 38–126)
Anion gap: 7 (ref 5–15)
BUN: 24 mg/dL — ABNORMAL HIGH (ref 6–20)
CALCIUM: 9.5 mg/dL (ref 8.9–10.3)
CHLORIDE: 104 mmol/L (ref 101–111)
CO2: 23 mmol/L (ref 22–32)
CREATININE: 1.57 mg/dL — AB (ref 0.61–1.24)
GFR calc Af Amer: 51 mL/min — ABNORMAL LOW (ref >60)
GFR, EST NON AFRICAN AMERICAN: 44 mL/min — AB (ref >60)
GLUCOSE: 115 mg/dL — AB (ref 65–99)
Potassium: 5.1 mmol/L (ref 3.5–5.1)
SODIUM: 134 mmol/L — AB (ref 135–145)
Total Bilirubin: 1 mg/dL (ref 0.3–1.2)
Total Protein: 7.5 g/dL (ref 6.5–8.1)

## 2015-09-03 NOTE — Discharge Instructions (Signed)
Hernia, Adult A hernia is the bulging of an organ or tissue through a weak spot in the muscles of the abdomen (abdominal wall). Hernias develop most often near the navel or groin. There are many kinds of hernias. Common kinds include:  Femoral hernia. This kind of hernia develops under the groin in the upper thigh area.  Inguinal hernia. This kind of hernia develops in the groin or scrotum.  Umbilical hernia. This kind of hernia develops near the navel.  Hiatal hernia. This kind of hernia causes part of the stomach to be pushed up into the chest.  Incisional hernia. This kind of hernia bulges through a scar from an abdominal surgery. CAUSES This condition may be caused by:  Heavy lifting.  Coughing over a long period of time.  Straining to have a bowel movement.  An incision made during an abdominal surgery.  A birth defect (congenital defect).  Excess weight or obesity.  Smoking.  Poor nutrition.  Cystic fibrosis.  Excess fluid in the abdomen.  Undescended testicles. SYMPTOMS Symptoms of a hernia include:  A lump on the abdomen. This is the first sign of a hernia. The lump may become more obvious with standing, straining, or coughing. It may get bigger over time if it is not treated or if the condition causing it is not treated.  Pain. A hernia is usually painless, but it may become painful over time if treatment is delayed. The pain is usually dull and may get worse with standing or lifting heavy objects. Sometimes a hernia gets tightly squeezed in the weak spot (strangulated) or stuck there (incarcerated) and causes additional symptoms. These symptoms may include:  Vomiting.  Nausea.  Constipation.  Irritability. DIAGNOSIS A hernia may be diagnosed with:  A physical exam. During the exam your health care provider may ask you to cough or to make a specific movement, because a hernia is usually more visible when you move.  Imaging tests. These can  include:  X-rays.  Ultrasound.  CT scan. TREATMENT A hernia that is small and painless may not need to be treated. A hernia that is large or painful may be treated with surgery. Inguinal hernias may be treated with surgery to prevent incarceration or strangulation. Strangulated hernias are always treated with surgery, because lack of blood to the trapped organ or tissue can cause it to die. Surgery to treat a hernia involves pushing the bulge back into place and repairing the weak part of the abdomen. HOME CARE INSTRUCTIONS  Avoid straining.  Do not lift anything heavier than 10 lb (4.5 kg).  Lift with your leg muscles, not your back muscles. This helps avoid strain.  When coughing, try to cough gently.  Prevent constipation. Constipation leads to straining with bowel movements, which can make a hernia worse or cause a hernia repair to break down. You can prevent constipation by:  Eating a high-fiber diet that includes plenty of fruits and vegetables.  Drinking enough fluids to keep your urine clear or pale yellow. Aim to drink 6-8 glasses of water per day.  Using a stool softener as directed by your health care provider.  Lose weight, if you are overweight.  Do not use any tobacco products, including cigarettes, chewing tobacco, or electronic cigarettes. If you need help quitting, ask your health care provider.  Keep all follow-up visits as directed by your health care provider. This is important. Your health care provider may need to monitor your condition. SEEK MEDICAL CARE IF:  You have   swelling, redness, and pain in the affected area.  Your bowel habits change. SEEK IMMEDIATE MEDICAL CARE IF:  You have a fever.  You have abdominal pain that is getting worse.  You feel nauseous or you vomit.  You cannot push the hernia back in place by gently pressing on it while you are lying down.  The hernia:  Changes in shape or size.  Is stuck outside the  abdomen.  Becomes discolored.  Feels hard or tender.   This information is not intended to replace advice given to you by your health care provider. Make sure you discuss any questions you have with your health care provider.   Document Released: 04/18/2005 Document Revised: 05/09/2014 Document Reviewed: 02/26/2014 Elsevier Interactive Patient Education 2016 [employer]  

## 2015-09-03 NOTE — ED Notes (Signed)
Pt reports RLQ hernia for the past year; reports pain increased for the past month; reports pain with walking. Pt reports difficulty eating, reports sharp pains after eating.

## 2015-09-03 NOTE — ED Provider Notes (Signed)
[facility] Emergency Department Provider Note  ____________________________________________    I have reviewed the triage vital signs and the nursing notes.   HISTORY  Chief Complaint Hernia    HPI Ross Fisher is a 70 y.o. male who presents with complaints of a hernia. Patient reports a right groin hernia for greater than a year but over the last month started to become warm or painful at the end of the day. He reports it is aching in nature and mild to moderate late in the day but feels well in the morning. He is able to push it back in without difficulty. No redness no fevers no nausea or vomiting. Normal bowel movements     Past Medical History  Diagnosis Date  . High cholesterol   . Hypertension   . Back pain   . COPD (chronic obstructive pulmonary disease) (HCC)   . Hypercholesteremia     There are no active problems to display for this patient.   Past Surgical History  Procedure Laterality Date  . Cataract extraction    . Rotator cuff repair      bilat    Current Outpatient Rx  Name  Route  Sig  Dispense  Refill  . acetaminophen-codeine (TYLENOL #3) 300-30 MG per tablet   Oral   Take 2 tablets by mouth 3 (three) times daily as needed. For pain.          Marland Kitchen albuterol (PROVENTIL HFA;VENTOLIN HFA) 108 (90 BASE) MCG/ACT inhaler   Inhalation   Inhale into the lungs every 6 (six) hours as needed for wheezing or shortness of breath.         Marland Kitchen amLODipine (NORVASC) 10 MG tablet   Oral   Take 10 mg by mouth daily.         . budesonide-formoterol (SYMBICORT) 160-4.5 MCG/ACT inhaler   Inhalation   Inhale 2 puffs into the lungs 2 (two) times daily.         . carvedilol (COREG) 12.5 MG tablet   Oral   Take 12.5 mg by mouth 2 (two) times daily with a meal.         . gabapentin (NEURONTIN) 400 MG capsule   Oral   Take 400 mg by mouth 3 (three) times daily as needed.          . methocarbamol (ROBAXIN) 500 MG tablet  Oral   Take 500 mg by mouth 3 (three) times daily.          . pseudoephedrine (SUDAFED) 60 MG tablet   Oral   Take 60 mg by mouth every 6 (six) hours as needed. For sinus pressure.          . simvastatin (ZOCOR) 20 MG tablet   Oral   Take 20 mg by mouth daily.         . traZODone (DESYREL) 150 MG tablet   Oral   Take by mouth at bedtime.           Allergies Review of patient's allergies indicates no known allergies.  No family history on file.  Social History Social History  Substance Use Topics  . Smoking status: Current Every Day Smoker    Types: Cigarettes  . Smokeless tobacco: Not on file  . Alcohol Use: No    Review of Systems  Constitutional: Negative for fever. Eyes: Negative for redness ENT: Negative for sore throat Cardiovascular: Negative for chest pain Respiratory: Negative for shortness of breath. Gastrointestinal: As above Genitourinary:  Negative for dysuria. Musculoskeletal: Negative for back pain. Skin: Negative for rash. Neurological: Negative for focal weakness Psychiatric: no anxiety    ____________________________________________   PHYSICAL EXAM:  VITAL SIGNS: ED Triage Vitals  Enc Vitals Group     BP 09/03/15 1024 105/77 mmHg     Pulse Rate 09/03/15 1024 85     Resp 09/03/15 1024 16     Temp 09/03/15 1024 98.4 F (36.9 C)     Temp Source 09/03/15 1024 Oral     SpO2 09/03/15 1024 99 %     Weight 09/03/15 1024 135 lb (61.236 kg)     Height 09/03/15 1024 5\' 8"  (1.727 m)     Head Cir --      Peak Flow --      Pain Score 09/03/15 1024 5     Pain Loc --      Pain Edu? --      Excl. in GC? --      Constitutional: Alert and oriented. Well appearing and in no distress. Pleasant and interactive Eyes: Conjunctivae are normal. No erythema or injection ENT   Head: Normocephalic and atraumatic.   Mouth/Throat: Mucous membranes are moist. Cardiovascular: Normal rate, regular rhythm. Normal and symmetric distal pulses are  present in the upper extremities. Respiratory: Normal respiratory effort without tachypnea nor retractions.  Gastrointestinal: Soft and non-tender in all quadrants. No distention. There is no CVA tenderness. Right inguinal hernia, easily reducible, nontender Genitourinary: deferred Musculoskeletal: Nontender with normal range of motion in all extremities. No lower extremity tenderness nor edema. Neurologic:  Normal speech and language. No gross focal neurologic deficits are appreciated. Skin:  Skin is warm, dry and intact. No rash noted. Psychiatric: Mood and affect are normal. Patient exhibits appropriate insight and judgment.  ____________________________________________    LABS (pertinent positives/negatives)  Labs Reviewed  COMPREHENSIVE METABOLIC PANEL - Abnormal; Notable for the following:    Sodium 134 (*)    Glucose, Bld 115 (*)    BUN 24 (*)    Creatinine, Ser 1.57 (*)    GFR calc non Af Amer 44 (*)    GFR calc Af Amer 51 (*)    All other components within normal limits  URINALYSIS COMPLETEWITH MICROSCOPIC ([facility] ONLY) - Abnormal; Notable for the following:    Color, Urine YELLOW (*)    APPearance CLEAR (*)    Bilirubin Urine 1+ (*)    All other components within normal limits  LIPASE, BLOOD  CBC    ____________________________________________   EKG  None  ____________________________________________    RADIOLOGY  None  ____________________________________________   PROCEDURES  Procedure(s) performed: none  Critical Care performed: none  ____________________________________________   INITIAL IMPRESSION / ASSESSMENT AND PLAN / ED COURSE  Pertinent labs & imaging results that were available during my care of the patient were reviewed by me and considered in my medical decision making (see chart for details).  Patient with nontender easily reducible right inguinal hernia. Recommend outpatient follow-up surgery for elective repair. Patient agrees  with this plan. Discussed return precautions  ____________________________________________   FINAL CLINICAL IMPRESSION(S) / ED DIAGNOSES  Final diagnoses:  Unilateral inguinal hernia without obstruction or gangrene, recurrence not specified          Jene Everyobert Blayne Garlick, MD 09/03/15 1542

## 2016-03-17 ENCOUNTER — Emergency Department (HOSPITAL_COMMUNITY)
Admission: EM | Admit: 2016-03-17 | Discharge: 2016-03-17 | Disposition: A | Discharge status: Home / Self Care | Payer: Medicare Other | Attending: Emergency Medicine | Admitting: Emergency Medicine

## 2016-03-17 ENCOUNTER — Encounter (HOSPITAL_COMMUNITY): Payer: Self-pay | Admitting: Emergency Medicine

## 2016-03-17 ENCOUNTER — Emergency Department (HOSPITAL_COMMUNITY): Payer: Medicare Other

## 2016-03-17 DIAGNOSIS — R55 Syncope and collapse: Secondary | ICD-10-CM

## 2016-03-17 DIAGNOSIS — F17210 Nicotine dependence, cigarettes, uncomplicated: Secondary | ICD-10-CM | POA: Diagnosis not present

## 2016-03-17 DIAGNOSIS — S72435A Nondisplaced fracture of medial condyle of left femur, initial encounter for closed fracture: Secondary | ICD-10-CM

## 2016-03-17 DIAGNOSIS — Y999 Unspecified external cause status: Secondary | ICD-10-CM | POA: Diagnosis not present

## 2016-03-17 DIAGNOSIS — J449 Chronic obstructive pulmonary disease, unspecified: Secondary | ICD-10-CM | POA: Insufficient documentation

## 2016-03-17 DIAGNOSIS — S79922A Unspecified injury of left thigh, initial encounter: Secondary | ICD-10-CM | POA: Diagnosis present

## 2016-03-17 DIAGNOSIS — I10 Essential (primary) hypertension: Secondary | ICD-10-CM | POA: Diagnosis not present

## 2016-03-17 DIAGNOSIS — W228XXA Striking against or struck by other objects, initial encounter: Secondary | ICD-10-CM | POA: Diagnosis not present

## 2016-03-17 DIAGNOSIS — Y929 Unspecified place or not applicable: Secondary | ICD-10-CM | POA: Diagnosis not present

## 2016-03-17 DIAGNOSIS — Y9301 Activity, walking, marching and hiking: Secondary | ICD-10-CM | POA: Diagnosis not present

## 2016-03-17 LAB — BASIC METABOLIC PANEL
Anion gap: 6 (ref 5–15)
BUN: 11 mg/dL (ref 6–20)
CO2: 27 mmol/L (ref 22–32)
CREATININE: 1.2 mg/dL (ref 0.61–1.24)
Calcium: 8.7 mg/dL — ABNORMAL LOW (ref 8.9–10.3)
Chloride: 104 mmol/L (ref 101–111)
Glucose, Bld: 126 mg/dL — ABNORMAL HIGH (ref 65–99)
POTASSIUM: 3.8 mmol/L (ref 3.5–5.1)
SODIUM: 137 mmol/L (ref 135–145)

## 2016-03-17 LAB — URINALYSIS, ROUTINE W REFLEX MICROSCOPIC
Bilirubin Urine: NEGATIVE
Glucose, UA: NEGATIVE mg/dL
Hgb urine dipstick: NEGATIVE
KETONES UR: NEGATIVE mg/dL
LEUKOCYTES UA: NEGATIVE
NITRITE: NEGATIVE
PROTEIN: NEGATIVE mg/dL
Specific Gravity, Urine: 1.015 (ref 1.005–1.030)
pH: 6.5 (ref 5.0–8.0)

## 2016-03-17 LAB — CBC
HCT: 38.2 % — ABNORMAL LOW (ref 39.0–52.0)
Hemoglobin: 12.9 g/dL — ABNORMAL LOW (ref 13.0–17.0)
MCH: 33 pg (ref 26.0–34.0)
MCHC: 33.8 g/dL (ref 30.0–36.0)
MCV: 97.7 fL (ref 78.0–100.0)
PLATELETS: 228 10*3/uL (ref 150–400)
RBC: 3.91 MIL/uL — ABNORMAL LOW (ref 4.22–5.81)
RDW: 13.6 % (ref 11.5–15.5)
WBC: 8.8 10*3/uL (ref 4.0–10.5)

## 2016-03-17 MED ORDER — HYDROMORPHONE HCL 2 MG/ML IJ SOLN
1.0000 mg | Freq: Once | INTRAMUSCULAR | Status: AC
  Administered 2016-03-17: 1 mg via INTRAVENOUS
  Filled 2016-03-17: qty 1

## 2016-03-17 MED ORDER — SODIUM CHLORIDE 0.9 % IV BOLUS (SEPSIS)
1000.0000 mL | Freq: Once | INTRAVENOUS | Status: AC
  Administered 2016-03-17: 1000 mL via INTRAVENOUS

## 2016-03-17 MED ORDER — MORPHINE SULFATE (PF) 4 MG/ML IV SOLN
4.0000 mg | Freq: Once | INTRAVENOUS | Status: AC
  Administered 2016-03-17: 4 mg via INTRAVENOUS
  Filled 2016-03-17: qty 1

## 2016-03-17 MED ORDER — HYDROCODONE-ACETAMINOPHEN 5-325 MG PO TABS: 2.0000 | ORAL_TABLET | ORAL | 0 refills | Status: DC | PRN

## 2016-03-17 NOTE — ED Notes (Signed)
Pt informed of why it is important to keep a check on his heart. Pt verbalizes understanding and is laughing with this RN.

## 2016-03-17 NOTE — ED Notes (Addendum)
Pt very agitated about being attached to monitor. Pt states that he is just here for his knee and doesn't need this stuff. Advised pt that if he had a syncopal episode, we need to investigate the case of that. Started cursing about the gown and about giving a urine sample.

## 2016-03-17 NOTE — Discharge Planning (Signed)
EDCM consulted to assist with DME and possible home health needs.  [facility] met with pt and family at bedside.  Pt daughter inquired about rolling walker.  EDCM noted that pt is unable to stand and therefore would not be able to use a rolling walker properly.  EDCM suggested crutches; pt would "trial" the crutches in the ED and if not successful, we would order a wheelchair.  Pt and family agreeable to plan.  Pt utilizes [facility] in [place] for medical needs and has an appointment with them tomorrow.  [facility]

## 2016-03-17 NOTE — ED Notes (Signed)
Pt is in stable condition upon d/c and is escorted from ED via wheelchair. 

## 2016-03-17 NOTE — ED Notes (Signed)
Patient transported to X-ray 

## 2016-03-17 NOTE — Discharge Instructions (Signed)
Follow up with orthopedic provider as soon as possible for re-evaluation. You will also need to follow up with your PCP regarding loss of consciousness. Keep scheduled appointment for PET scan tomorrow. Drink plenty of fluids. Take pain medication as needed. Bear weight on left leg as tolerated. Return to the ED if you experience severe worsening of your symptoms, loss of consciousness, weakness, chest pain, difficulty breathing.

## 2016-03-17 NOTE — Progress Notes (Signed)
Orthopedic Tech Progress Note Patient Details:  Ross Fisher 10/15/1945 [id number]  Ortho Devices Type of Ortho Device: Crutches, Knee Immobilizer Ortho Device/Splint Location: lle Ortho Device/Splint Interventions: Application   Ross Fisher 03/17/2016, 2:57 PM

## 2016-03-17 NOTE — ED Triage Notes (Signed)
Pt states he felt dizzy last night and passed out. Denies CP/n/v/SOB. Pt hurt left knee when he fell and states he cannot put pressure on it.

## 2016-03-17 NOTE — ED Notes (Signed)
Notified Camile,CM

## 2016-03-17 NOTE — ED Notes (Signed)
Pt had no complaints during taking of orthostatic VS.

## 2016-03-17 NOTE — ED Provider Notes (Signed)
[facility]-EMERGENCY DEPT Provider Note   CSN: [id number] Arrival date & time: 03/17/16  0935     History   Chief Complaint Chief Complaint  Patient presents with  . Loss of Consciousness    HPI Ross Fisher is a 70 y.o. male with a past medical history of HTN, HLD, COPD who presents to the ED today complaining of syncope and knee pain. Patient states that last night around 11 PM he stood up from a sitting position and tried to walk into the kitchen to get some ice cream. He states that on the way there he began feeling very dizzy and lightheaded. Patient thought that he was going to pass out so he grabbed ahold of the sink counter top and attempted to lower himself down to the ground. He states that on the way down he passed out for a brief "10th of a second" and landed on the ground. He states that he struck his left knee on the ground as well as his chin and nose. He denies any chest pain or shortness of breath. He states he gets this dizziness sensation when he stands up very often. However, he states is the first time he has passed out from it. He is now primarily complaining of pain in his left knee. He states he is unable to ambulate or put any pressure on the knee due to pain. He notes it is very swollen as well. He has not tried taking anything for his pain at home.  Of note, patient has a 50+ pack year history. He is going to get a PET scan tomorrow due to enlarged bilateral inguinal lymph nodes. He also notes a 20 pound unintentional weight loss in the last 1-2 months as well as associated night sweats. He states he also does not eat that frequently as he has chronic IBS and diarrhea.  HPI  Past Medical History:  Diagnosis Date  . Back pain   . COPD (chronic obstructive pulmonary disease) (HCC)   . High cholesterol   . Hypercholesteremia   . Hypertension     There are no active problems to display for this patient.   Past Surgical History:  Procedure Laterality Date  .  CATARACT EXTRACTION    . ROTATOR CUFF REPAIR     bilat       Home Medications    Prior to Admission medications   Medication Sig Start Date End Date Taking? Authorizing Provider  albuterol (PROVENTIL HFA;VENTOLIN HFA) 108 (90 BASE) MCG/ACT inhaler Inhale into the lungs every 6 (six) hours as needed for wheezing or shortness of breath.   Yes Historical Provider, MD  amLODipine (NORVASC) 10 MG tablet Take 10 mg by mouth daily.   Yes Historical Provider, MD  budesonide-formoterol (SYMBICORT) 160-4.5 MCG/ACT inhaler Inhale 2 puffs into the lungs 2 (two) times daily.   Yes Historical Provider, MD  carvedilol (COREG) 12.5 MG tablet Take 12.5 mg by mouth 2 (two) times daily with a meal.   Yes Historical Provider, MD  gabapentin (NEURONTIN) 400 MG capsule Take 400 mg by mouth 3 (three) times daily as needed (pain).    Yes Historical Provider, MD  methocarbamol (ROBAXIN) 500 MG tablet Take 500 mg by mouth 3 (three) times daily.    Yes Historical Provider, MD  pseudoephedrine (SUDAFED) 60 MG tablet Take 60 mg by mouth every 6 (six) hours as needed. For sinus pressure.    Yes Historical Provider, MD  simvastatin (ZOCOR) 20 MG tablet Take 20 mg  by mouth daily.   Yes Historical Provider, MD  traZODone (DESYREL) 150 MG tablet Take 300 mg by mouth at bedtime.    Yes Historical Provider, MD    Family History History reviewed. No pertinent family history.  Social History Social History  Substance Use Topics  . Smoking status: Current Every Day Smoker    Types: Cigarettes  . Smokeless tobacco: Never Used  . Alcohol use No     Allergies   Patient has no known allergies.   Review of Systems Review of Systems  All other systems reviewed and are negative.    Physical Exam Updated Vital Signs BP 159/83   Pulse 68   Temp 98 F (36.7 C) (Oral)   Resp 14   Ht 5\' 8"  (1.727 m)   Wt 61.2 kg   SpO2 100%   BMI 20.53 kg/m   Physical Exam  Constitutional: He is oriented to person, place,  and time. No distress.  HENT:  Head: Normocephalic and atraumatic.  Mouth/Throat: No oropharyngeal exudate.  Eyes: Conjunctivae and EOM are normal. Pupils are equal, round, and reactive to light. Right eye exhibits no discharge. Left eye exhibits no discharge. No scleral icterus.  Cardiovascular: Normal rate, regular rhythm, normal heart sounds and intact distal pulses.  Exam reveals no gallop and no friction rub.   No murmur heard. Pulmonary/Chest: Effort normal and breath sounds normal. No respiratory distress. He has no wheezes. He has no rales. He exhibits no tenderness.  Abdominal: Soft. He exhibits no distension. There is no tenderness. There is no guarding.  Musculoskeletal: Normal range of motion. He exhibits no edema.  L knee: Severe TTP over medial aspect of L knee. Positive ballottement test with significant swelling. No ecchymosis or erythema. No obvious bony deformity. Intact popliteal and DP pulses. Pain increased with flexion.  Neurological: He is alert and oriented to person, place, and time.  Strength 5/5 throughout. No sensory deficits. No dysmetria. No slurred speech. No facial droop. Negative pronator drift.    Skin: Skin is warm and dry. No rash noted. He is not diaphoretic. No erythema. No pallor.  Psychiatric: He has a normal mood and affect. His behavior is normal.  Nursing note and vitals reviewed.    ED Treatments / Results  Labs (all labs ordered are listed, but only abnormal results are displayed) Labs Reviewed  BASIC METABOLIC PANEL - Abnormal; Notable for the following:       Result Value   Glucose, Bld 126 (*)    Calcium 8.7 (*)    All other components within normal limits  CBC - Abnormal; Notable for the following:    RBC 3.91 (*)    Hemoglobin 12.9 (*)    HCT 38.2 (*)    All other components within normal limits  URINALYSIS, ROUTINE W REFLEX MICROSCOPIC (NOT AT [facility])    EKG  EKG Interpretation None       Radiology Dg Knee Complete 4 Views  Left  Result Date: 03/17/2016 CLINICAL DATA:  Patient with dizziness. Fall. Medial knee pain and swelling. Initial encounter. EXAM: LEFT KNEE - COMPLETE 4+ VIEW COMPARISON:  None. FINDINGS: Normal anatomic alignment. No evidence for acute displaced fracture. Moderate joint effusion. Vascular calcifications. Lucency projecting over the medial condyle on oblique view likely secondary to overlapping structures. IMPRESSION: No definite displaced fracture. Lucency projecting over the medial condyle on the oblique view favored to be secondary to overlapping tissues. Recommend correlation for point tenderness. Moderate effusion. Electronically Signed   By:  Annia Belt M.D.   On: 03/17/2016 10:58    Procedures Procedures (including critical care time)  Medications Ordered in ED Medications  sodium chloride 0.9 % bolus 1,000 mL (1,000 mLs Intravenous New Bag/Given 03/17/16 1148)  morphine 4 MG/ML injection 4 mg (4 mg Intravenous Given 03/17/16 1148)     Initial Impression / Assessment and Plan / ED Course  I have reviewed the triage vital signs and the nursing notes.  Pertinent labs & imaging results that were available during my care of the patient were reviewed by me and considered in my medical decision making (see chart for details).  Clinical Course    70 y.o M with a pmhx of HTN, HLD, 50+pack year hx presents to the ED c/o syncope and left knee pain. Pt stood up last night and became lightheaded, attempted to lower himself down to the ground before passing out but states that he did pass out for a brief moment before falling onto his left knee.  Pt states that he was out for "a 10th of a second" because the pain from his knee woke him up. He has not bene able to ambulate onhis knee since the fall. On presentation to ED, pt appears uncomfortable, complaining of severe pain in L knee. VSS. Pt reports that he gets very lightheaded upone standing often. No CP or SOB occurred during episode of  syncope. Per family, he has had unintentional wt loss of >20lbs in recent months, night sweats. Pt also has very poor PO intake. He has scheduled PET scan tomorrow by [facility] for concern of Ca. Pt was given fluids and pain medication in ED. EKG shows no sign of ischemia. No AS murmur heard on exam. Lab work unremarkable. Syncope likely due to orthostasis. L knee is significant swollen with positive ballottement test. Pt unable to bear any weight on left knee due to pain. Xray reveals moderate effusion. However, concern for underlying fracture so CT obtained which reveals sagittally oriented nondisplaced fracture of the head posteromedial portion of the medial femoral epicondyle. Fx is primarily within the oint capsule. Pt placed in knee immobilizer. Case management consulted to help arrange for home health, Pt and OT. PT does have help at home. Pt offered walker, but he is requesting crutches instead. [facility] and [facility]. Referral for local ortho also given today. Will d/c with pain medication. Return precautions outlined in patient discharge instructions.   Patient was discussed with and seen by Dr. Eudelia Bunch who agrees with the treatment plan.     Final Clinical Impressions(s) / ED Diagnoses   Final diagnoses:  Syncope, unspecified syncope type  Closed nondisplaced fracture of medial condyle of left femur, initial encounter [facility])    New Prescriptions New Prescriptions   No medications on file     Dub Mikes, PA-C 03/21/16 1344    Nira Conn, MD 03/21/16 1731

## 2016-06-20 ENCOUNTER — Emergency Department: Payer: Medicare Other

## 2016-06-20 ENCOUNTER — Emergency Department
Admission: EM | Admit: 2016-06-20 | Discharge: 2016-06-21 | Disposition: A | Discharge status: Home / Self Care | Payer: Medicare Other | Attending: Emergency Medicine | Admitting: Emergency Medicine

## 2016-06-20 ENCOUNTER — Encounter: Payer: Self-pay | Admitting: Emergency Medicine

## 2016-06-20 DIAGNOSIS — Z79899 Other long term (current) drug therapy: Secondary | ICD-10-CM | POA: Insufficient documentation

## 2016-06-20 DIAGNOSIS — F17210 Nicotine dependence, cigarettes, uncomplicated: Secondary | ICD-10-CM | POA: Insufficient documentation

## 2016-06-20 DIAGNOSIS — R509 Fever, unspecified: Secondary | ICD-10-CM | POA: Diagnosis not present

## 2016-06-20 DIAGNOSIS — R531 Weakness: Secondary | ICD-10-CM

## 2016-06-20 DIAGNOSIS — R42 Dizziness and giddiness: Secondary | ICD-10-CM | POA: Insufficient documentation

## 2016-06-20 DIAGNOSIS — J449 Chronic obstructive pulmonary disease, unspecified: Secondary | ICD-10-CM | POA: Insufficient documentation

## 2016-06-20 DIAGNOSIS — R0602 Shortness of breath: Secondary | ICD-10-CM | POA: Diagnosis not present

## 2016-06-20 DIAGNOSIS — I10 Essential (primary) hypertension: Secondary | ICD-10-CM | POA: Insufficient documentation

## 2016-06-20 LAB — COMPREHENSIVE METABOLIC PANEL
ALT: 20 U/L (ref 17–63)
AST: 20 U/L (ref 15–41)
Albumin: 4.8 g/dL (ref 3.5–5.0)
Alkaline Phosphatase: 52 U/L (ref 38–126)
Anion gap: 9 (ref 5–15)
BUN: 11 mg/dL (ref 6–20)
CHLORIDE: 102 mmol/L (ref 101–111)
CO2: 26 mmol/L (ref 22–32)
Calcium: 9 mg/dL (ref 8.9–10.3)
Creatinine, Ser: 1.1 mg/dL (ref 0.61–1.24)
GFR calc Af Amer: >60 mL/min (ref >60)
Glucose, Bld: 99 mg/dL (ref 65–99)
POTASSIUM: 3.5 mmol/L (ref 3.5–5.1)
Sodium: 137 mmol/L (ref 135–145)
Total Bilirubin: 0.9 mg/dL (ref 0.3–1.2)
Total Protein: 8 g/dL (ref 6.5–8.1)

## 2016-06-20 LAB — URINALYSIS, COMPLETE (UACMP) WITH MICROSCOPIC
BACTERIA UA: NONE SEEN
BILIRUBIN URINE: NEGATIVE
Glucose, UA: NEGATIVE mg/dL
HGB URINE DIPSTICK: NEGATIVE
KETONES UR: NEGATIVE mg/dL
LEUKOCYTES UA: NEGATIVE
NITRITE: NEGATIVE
PH: 7 (ref 5.0–8.0)
Protein, ur: NEGATIVE mg/dL
RBC / HPF: NONE SEEN RBC/hpf (ref 0–5)
SPECIFIC GRAVITY, URINE: 1.008 (ref 1.005–1.030)
Squamous Epithelial / LPF: NONE SEEN
WBC, UA: NONE SEEN WBC/hpf (ref 0–5)

## 2016-06-20 LAB — CBC WITH DIFFERENTIAL/PLATELET
BASOS PCT: 1 %
Basophils Absolute: 0.1 10*3/uL (ref 0–0.1)
EOS ABS: 0.4 10*3/uL (ref 0–0.7)
Eosinophils Relative: 4 %
HEMATOCRIT: 44.9 % (ref 40.0–52.0)
HEMOGLOBIN: 15.2 g/dL (ref 13.0–18.0)
Lymphocytes Relative: 23 %
Lymphs Abs: 2.3 10*3/uL (ref 1.0–3.6)
MCH: 33.5 pg (ref 26.0–34.0)
MCHC: 33.8 g/dL (ref 32.0–36.0)
MCV: 98.9 fL (ref 80.0–100.0)
MONO ABS: 0.8 10*3/uL (ref 0.2–1.0)
Monocytes Relative: 8 %
NEUTROS ABS: 6.3 10*3/uL (ref 1.4–6.5)
Neutrophils Relative %: 64 %
Platelets: 273 10*3/uL (ref 150–440)
RBC: 4.54 MIL/uL (ref 4.40–5.90)
RDW: 13.6 % (ref 11.5–14.5)
WBC: 10 10*3/uL (ref 3.8–10.6)

## 2016-06-20 LAB — BRAIN NATRIURETIC PEPTIDE: B Natriuretic Peptide: 127 pg/mL — ABNORMAL HIGH (ref 0.0–100.0)

## 2016-06-20 LAB — INFLUENZA PANEL BY PCR (TYPE A & B)
INFLAPCR: NEGATIVE
INFLBPCR: NEGATIVE

## 2016-06-20 LAB — TROPONIN I: Troponin I: <0.03 ng/mL (ref <0.03)

## 2016-06-20 LAB — LACTIC ACID, PLASMA: LACTIC ACID, VENOUS: 1.1 mmol/L (ref 0.5–1.9)

## 2016-06-20 MED ORDER — SODIUM CHLORIDE 0.9 % IV SOLN
Freq: Once | INTRAVENOUS | Status: AC
  Administered 2016-06-20: 1000 mL via INTRAVENOUS

## 2016-06-20 MED ORDER — ONDANSETRON HCL 4 MG/2ML IJ SOLN
4.0000 mg | Freq: Once | INTRAMUSCULAR | Status: AC
  Administered 2016-06-20: 4 mg via INTRAVENOUS

## 2016-06-20 MED ORDER — ONDANSETRON HCL 4 MG/2ML IJ SOLN
INTRAMUSCULAR | Status: AC
  Administered 2016-06-20: 4 mg via INTRAVENOUS
  Filled 2016-06-20: qty 2

## 2016-06-20 MED ORDER — IOPAMIDOL (ISOVUE-300) INJECTION 61%
100.0000 mL | Freq: Once | INTRAVENOUS | Status: AC | PRN
  Administered 2016-06-20: 100 mL via INTRAVENOUS

## 2016-06-20 MED ORDER — IOPAMIDOL (ISOVUE-300) INJECTION 61%
30.0000 mL | Freq: Once | INTRAVENOUS | Status: AC | PRN
  Administered 2016-06-20: 30 mL via ORAL

## 2016-06-20 MED ORDER — AZITHROMYCIN 500 MG PO TABS
500.0000 mg | ORAL_TABLET | Freq: Once | ORAL | Status: AC
Start: 2016-06-20 — End: 2016-06-21
  Administered 2016-06-21: 500 mg via ORAL
  Filled 2016-06-20: qty 1

## 2016-06-20 MED ORDER — AZITHROMYCIN 250 MG PO TABS: ORAL_TABLET | ORAL | 0 refills | Status: AC

## 2016-06-20 NOTE — ED Notes (Signed)
Patient states they are unable to drink any more contrast but will try to sip on it.  I educated pt and family on importance of completing at least the first bottle.

## 2016-06-20 NOTE — Discharge Instructions (Signed)
[facility]. Please tell them you had some swollen lymph glands in your belly. Also please tell him about your weight loss and weakness. I will give you the Zithromax for the bronchitis. Please return if you're worse.

## 2016-06-20 NOTE — ED Triage Notes (Signed)
Pt comes in for dizziness for last 12 days.  Pt is gray in color.  Has had SHOB. Appears ill in triage. Has had chills as well.  Denies pain at this time. Has not been seen yet.

## 2016-06-20 NOTE — ED Provider Notes (Addendum)
[facility] Emergency Department Provider Note   ____________________________________________   First MD Initiated Contact with Patient 06/20/16 1808     (approximate)  I have reviewed the triage vital signs and the nursing notes.   HISTORY  Chief Complaint Dizziness   HPI Ross Fisher is a 71 y.o. male who reports 2-3 weeks of increasing shortness of breath shaking chills fever and productive cough. He reports he is losing weight as well. He smokes but not as much as it had been. He's had a pain in the right upper quadrant for a year or 2. He has no other pain. His mother reports he has a normal hernia she reports she's being followed for that and has not caused any problems. He does not come out.   Past Medical History:  Diagnosis Date  . Back pain   . COPD (chronic obstructive pulmonary disease) (HCC)   . High cholesterol   . Hypercholesteremia   . Hypertension     There are no active problems to display for this patient.   Past Surgical History:  Procedure Laterality Date  . CATARACT EXTRACTION    . ROTATOR CUFF REPAIR     bilat    Prior to Admission medications   Medication Sig Start Date End Date Taking? Authorizing Provider  acetaminophen-codeine (TYLENOL #3) 300-30 MG tablet Take 1 tablet by mouth every 4 (four) hours as needed for moderate pain.   Yes Historical Provider, MD  amLODipine (NORVASC) 10 MG tablet Take 10 mg by mouth daily.   Yes Historical Provider, MD  budesonide-formoterol (SYMBICORT) 160-4.5 MCG/ACT inhaler Inhale 2 puffs into the lungs 2 (two) times daily.   Yes Historical Provider, MD  carvedilol (COREG) 12.5 MG tablet Take 12.5 mg by mouth 2 (two) times daily with a meal.   Yes Historical Provider, MD  methocarbamol (ROBAXIN) 500 MG tablet Take 500 mg by mouth 3 (three) times daily.    Yes Historical Provider, MD  simvastatin (ZOCOR) 20 MG tablet Take 20 mg by mouth daily.   Yes Historical Provider, MD    traZODone (DESYREL) 150 MG tablet Take 300 mg by mouth at bedtime.    Yes Historical Provider, MD  albuterol (PROVENTIL HFA;VENTOLIN HFA) 108 (90 BASE) MCG/ACT inhaler Inhale into the lungs every 6 (six) hours as needed for wheezing or shortness of breath.    Historical Provider, MD  azithromycin (ZITHROMAX Z-PAK) 250 MG tablet Take 2 tablets (500 mg) on  Day 1,  followed by 1 tablet (250 mg) once daily on Days 2 through 5. 06/20/16 06/25/16  Arnaldo Natal, MD  gabapentin (NEURONTIN) 400 MG capsule Take 400 mg by mouth 3 (three) times daily as needed (pain).     Historical Provider, MD  omeprazole (PRILOSEC) 20 MG capsule Take 20 mg by mouth daily.    Historical Provider, MD  pseudoephedrine (SUDAFED) 60 MG tablet Take 60 mg by mouth every 6 (six) hours as needed. For sinus pressure.     Historical Provider, MD    Allergies Patient has no known allergies.  History reviewed. No pertinent family history.  Social History Social History  Substance Use Topics  . Smoking status: Current Every Day Smoker    Types: Cigarettes  . Smokeless tobacco: Never Used  . Alcohol use No    Review of Systems Constitutional:  fever/chills Eyes: No visual changes. ENT: No sore throat. Cardiovascular: Denies chest pain. Respiratory:  shortness of breath. Gastrointestinal: No abdominal pain.  No nausea,  no vomiting.  No diarrhea.  No constipation. Genitourinary: Negative for dysuria. Musculoskeletal: Negative for back pain. Skin: Negative for rash. Neurological: Negative for headaches, focal weakness or numbness.  10-point ROS otherwise negative.  ____________________________________________   PHYSICAL EXAM:  VITAL SIGNS: ED Triage Vitals [06/20/16 1757]  Enc Vitals Group     BP (!) 149/102     Pulse Rate 80     Resp 20     Temp      Temp src      SpO2 100 %     Weight      Height      Head Circumference      Peak Flow      Pain Score      Pain Loc      Pain Edu?    Constitutional:  Alert and oriented. Ill-appearing appearing but in no acute distress. Eyes: Conjunctivae are normal. PERRL. EOMI. Head: Atraumatic. Nose: No congestion/rhinnorhea. Mouth/Throat: Mucous membranes are moist.  Oropharynx non-erythematous. Neck: No stridor.  Cardiovascular: Normal rate, regular rhythm. Grossly normal heart sounds.  Good peripheral circulation. Respiratory: Normal respiratory effort.  No retractions. Lungs CTAB. Gastrointestinal: Soft and nontender. No distention. No abdominal bruits. No CVA tenderness. Musculoskeletal: No lower extremity tenderness nor edema.  No joint effusions. Neurologic:  Normal speech and language. No gross focal neurologic deficits are appreciated. No gait instability.   ____________________________________________   LABS (all labs ordered are listed, but only abnormal results are displayed)  Labs Reviewed  URINALYSIS, COMPLETE (UACMP) WITH MICROSCOPIC - Abnormal; Notable for the following:       Result Value   Color, Urine STRAW (*)    APPearance CLEAR (*)    All other components within normal limits  BRAIN NATRIURETIC PEPTIDE - Abnormal; Notable for the following:    B Natriuretic Peptide 127.0 (*)    All other components within normal limits  CULTURE, BLOOD (ROUTINE X 2)  CULTURE, BLOOD (ROUTINE X 2)  COMPREHENSIVE METABOLIC PANEL  TROPONIN I  LACTIC ACID, PLASMA  CBC WITH DIFFERENTIAL/PLATELET  TROPONIN I  INFLUENZA PANEL BY PCR (TYPE A & B)  SEDIMENTATION RATE   ____________________________________________  EKG EKG read and interpreted by me shows normal sinus rhythm rate of 84 normal axis no obvious acute ST-T wave changes computer is reading minimal ST elevation inferiorly and not sure that that isn't actually PR segment depression. ____________________________________________  RADIOLOGY  Study Result   CLINICAL DATA:  Dizziness for 12 days.  EXAM: CHEST  2 VIEW  COMPARISON:  CT chest and PA and lateral chest  05/02/2011.  FINDINGS: The lungs are emphysematous but clear. Heart size is normal. No pneumothorax or pleural fluid. Aortic atherosclerosis is noted. No acute bony abnormality.  IMPRESSION: Emphysema without acute disease.  Atherosclerosis.   Electronically Signed   By: Drusilla Kanner M.D.   On: 06/20/2016 18:44    Study Result   CLINICAL DATA:  Acute onset of dizziness. Grayish color to the skin. Chills. Initial encounter.  EXAM: CT CHEST, ABDOMEN, AND PELVIS WITH CONTRAST  TECHNIQUE: Multidetector CT imaging of the chest, abdomen and pelvis was performed following the standard protocol during bolus administration of intravenous contrast.  CONTRAST:  ISOVUE-300 IOPAMIDOL (ISOVUE-300) INJECTION 61%  COMPARISON:  Chest radiograph performed earlier today at 6:21 p.m., and CTA of the chest performed 05/02/2011  FINDINGS: CT CHEST FINDINGS  Cardiovascular: Diffuse coronary artery calcifications are seen. The heart remains normal in size. Scattered calcification is noted along the aortic arch  and descending thoracic aorta. The great vessels are grossly unremarkable in appearance.  Mediastinum/Nodes: No mediastinal lymphadenopathy is seen. Calcified mediastinal nodes are noted at the subcarinal region, reflecting remote granulomatous disease. No pericardial effusion is identified. The thyroid gland is unremarkable. No axillary lymphadenopathy is appreciated.  Lungs/Pleura: Nodular scarring is noted at the lung apices. Mild emphysema is noted at the lung apices. The lungs are otherwise clear. No pleural effusion or pneumothorax is seen. No masses are identified.  Musculoskeletal: No acute osseous abnormalities are identified. The patient is status post bilateral rotator cuff repair. The visualized musculature is unremarkable in appearance.  CT ABDOMEN PELVIS FINDINGS  Hepatobiliary: The liver is unremarkable in appearance.  The gallbladder is unremarkable in appearance. The common bile duct remains normal in caliber.  Pancreas: The pancreas is within normal limits.  Spleen: The spleen is unremarkable in appearance.  Adrenals/Urinary Tract: The adrenal glands are unremarkable in appearance.  Scattered bilateral renal cysts are seen, with mild bilateral renal scarring. Mild nonspecific perinephric stranding is noted bilaterally. Vascular calcifications are seen at the renal hila bilaterally. There is no evidence of hydronephrosis. No renal or ureteral stones are identified.  Stomach/Bowel: The stomach is unremarkable in appearance. The small bowel is within normal limits. The appendix is normal in caliber, without evidence of appendicitis. The colon is unremarkable in appearance.  Vascular/Lymphatic: Scattered calcification is seen along the abdominal aorta and its branches. The abdominal aorta is otherwise grossly unremarkable. The inferior vena cava is grossly unremarkable. Mildly prominent retroperitoneal nodes measure up to 9 mm in size, of uncertain significance. No pelvic sidewall lymphadenopathy is identified.  Reproductive: The bladder is significantly distended and grossly unremarkable. The prostate is borderline normal in size.  Other: No additional soft tissue abnormalities are seen.  Musculoskeletal: No acute osseous abnormalities are identified. The visualized musculature is unremarkable in appearance.  IMPRESSION: 1. No acute abnormality seen to explain the patient's symptoms. 2. Scattered aortic atherosclerosis. 3. Mildly prominent retroperitoneal nodes measure up to 9 mm, of uncertain significance. 4. Diffuse coronary artery calcifications seen. 5. Nodular scarring at the lung apices. Mild emphysema at the lung apices. 6. Scattered bilateral renal cysts, with mild bilateral renal scarring.   Electronically Signed   By: Roanna RaiderJeffery  Chang M.D.   On: 06/20/2016  22:21    ____________________________________________   PROCEDURES  Procedure(s) performed:   Procedures  Critical Care performed:   ____________________________________________   INITIAL IMPRESSION / ASSESSMENT AND PLAN / ED COURSE  Pertinent labs & imaging results that were available during my care of the patient were reviewed by me and considered in my medical decision making (see chart for details).   Discussed with patient and family his mother and sister the fact that I have done a CAT scan was in the blood work is really not found anything. I'm still waiting for the sedimentation rate. Unless this is very high will plan on letting him go and [facility]. Family reports he has had prominent lymph glands in his abdomen before in fact they recently did a PET scan which did not show anything significant because of abdominal lymph glands.     ____________________________________________   FINAL CLINICAL IMPRESSION(S) / ED DIAGNOSES  Final diagnoses:  Lightheadedness  Weak      NEW MEDICATIONS STARTED DURING THIS VISIT:  New Prescriptions   AZITHROMYCIN (ZITHROMAX Z-PAK) 250 MG TABLET    Take 2 tablets (500 mg) on  Day 1,  followed by  1 tablet (250 mg) once daily on Days 2 through 5.     Note:  This document was prepared using Dragon voice recognition software and may include unintentional dictation errors.   A shouldn't has had Zithromax several times before and done well. I think that's the best choice given the other choices of Levaquin and amoxicillin and amoxicillin probably wouldn't work too well. Arnaldo Natal, MD 06/21/16 0002    Arnaldo Natal, MD 06/21/16 [phone]

## 2016-06-20 NOTE — ED Notes (Signed)
Patient is complaining of dizziness for over a week.  Patient's skin appears grey color.  Patient reports being a heavy smoker but states that he is smoking less than normal.  Patient is in no obvious distress at this time.  Patient speaking in full sentences.

## 2016-06-20 NOTE — ED Notes (Signed)
Pt finished one bottle of contrast, unable to complete the second bottle, informed Dr.MaLinda, " ok to not drink the second bottle", CT tech informed , Primary RN informed

## 2016-06-21 LAB — SEDIMENTATION RATE: SED RATE: 5 mm/h (ref 0–20)

## 2016-06-25 LAB — CULTURE, BLOOD (ROUTINE X 2)
CULTURE: NO GROWTH
CULTURE: NO GROWTH

## 2016-11-29 ENCOUNTER — Emergency Department
Admission: EM | Admit: 2016-11-29 | Discharge: 2016-11-29 | Disposition: A | Discharge status: Home / Self Care | Payer: Medicare Other | Attending: Student in an Organized Health Care Education/Training Program | Admitting: Student in an Organized Health Care Education/Training Program

## 2016-11-29 ENCOUNTER — Encounter: Payer: Self-pay | Admitting: Emergency Medicine

## 2016-11-29 ENCOUNTER — Emergency Department: Payer: Medicare Other

## 2016-11-29 DIAGNOSIS — Z79899 Other long term (current) drug therapy: Secondary | ICD-10-CM | POA: Diagnosis not present

## 2016-11-29 DIAGNOSIS — F17210 Nicotine dependence, cigarettes, uncomplicated: Secondary | ICD-10-CM | POA: Diagnosis not present

## 2016-11-29 DIAGNOSIS — I10 Essential (primary) hypertension: Secondary | ICD-10-CM | POA: Diagnosis not present

## 2016-11-29 DIAGNOSIS — E860 Dehydration: Secondary | ICD-10-CM | POA: Diagnosis not present

## 2016-11-29 DIAGNOSIS — R1031 Right lower quadrant pain: Secondary | ICD-10-CM | POA: Insufficient documentation

## 2016-11-29 DIAGNOSIS — J449 Chronic obstructive pulmonary disease, unspecified: Secondary | ICD-10-CM | POA: Insufficient documentation

## 2016-11-29 DIAGNOSIS — R197 Diarrhea, unspecified: Secondary | ICD-10-CM | POA: Insufficient documentation

## 2016-11-29 LAB — COMPREHENSIVE METABOLIC PANEL
ALBUMIN: 4.3 g/dL (ref 3.5–5.0)
ALT: 21 U/L (ref 17–63)
ANION GAP: 9 (ref 5–15)
AST: 28 U/L (ref 15–41)
Alkaline Phosphatase: 49 U/L (ref 38–126)
BILIRUBIN TOTAL: 1.3 mg/dL — AB (ref 0.3–1.2)
BUN: 25 mg/dL — ABNORMAL HIGH (ref 6–20)
CHLORIDE: 102 mmol/L (ref 101–111)
CO2: 25 mmol/L (ref 22–32)
Calcium: 9.2 mg/dL (ref 8.9–10.3)
Creatinine, Ser: 1.36 mg/dL — ABNORMAL HIGH (ref 0.61–1.24)
GFR calc Af Amer: 60 mL/min — ABNORMAL LOW (ref >60)
GFR, EST NON AFRICAN AMERICAN: 52 mL/min — AB (ref >60)
GLUCOSE: 181 mg/dL — AB (ref 65–99)
POTASSIUM: 4.7 mmol/L (ref 3.5–5.1)
Sodium: 136 mmol/L (ref 135–145)
Total Protein: 7 g/dL (ref 6.5–8.1)

## 2016-11-29 LAB — CBC
HEMATOCRIT: 44.3 % (ref 40.0–52.0)
HEMOGLOBIN: 15 g/dL (ref 13.0–18.0)
MCH: 33.4 pg (ref 26.0–34.0)
MCHC: 33.8 g/dL (ref 32.0–36.0)
MCV: 98.8 fL (ref 80.0–100.0)
Platelets: 227 10*3/uL (ref 150–440)
RBC: 4.48 MIL/uL (ref 4.40–5.90)
RDW: 13.6 % (ref 11.5–14.5)
WBC: 9.3 10*3/uL (ref 3.8–10.6)

## 2016-11-29 LAB — LIPASE, BLOOD: Lipase: 26 U/L (ref 11–51)

## 2016-11-29 MED ORDER — SODIUM CHLORIDE 0.9 % IV BOLUS (SEPSIS)
1000.0000 mL | Freq: Once | INTRAVENOUS | Status: AC
  Administered 2016-11-29: 1000 mL via INTRAVENOUS

## 2016-11-29 MED ORDER — IOPAMIDOL (ISOVUE-300) INJECTION 61%
100.0000 mL | Freq: Once | INTRAVENOUS | Status: AC | PRN
  Administered 2016-11-29: 100 mL via INTRAVENOUS
  Filled 2016-11-29: qty 100

## 2016-11-29 NOTE — ED Triage Notes (Signed)
Pt with abd pain and hernia pain for one year but worse in the last week with diarrhea for 9 days.

## 2016-11-29 NOTE — ED Notes (Signed)
Signature pad not working,  Pt verbalized understanding of D/C instructions.

## 2016-11-29 NOTE — ED Notes (Signed)
See triage note  States he has had intermittent problems with a hernia to groin  [facility]   They said his hernia had to be active before they doing any surgery

## 2016-11-29 NOTE — ED Provider Notes (Signed)
[facility] Emergency Department Provider Note    First MD Initiated Contact with Patient 11/29/16 1434     (approximate)  I have reviewed the triage vital signs and the nursing notes.   HISTORY  Chief Complaint Diarrhea and Hernia    HPI Ross Fisher is a 71 y.o. male reported history of right inguinal hernia for the past year presents with worsening right lower quadrant pain associated with diarrhea for this week. Has had associated nausea but no vomiting. States that he's having multiple episodes of nonbloody non-melanotic diarrhea. Does feel dehydrated. States he has not [facility]. Denies any fevers. No chest pain or shortness of breath. States the pain is worse when he standing or walking around. States that the pain gets better when he lays flat.   Past Medical History:  Diagnosis Date  . Back pain   . COPD (chronic obstructive pulmonary disease) (HCC)   . High cholesterol   . Hypercholesteremia   . Hypertension    No family history on file. Past Surgical History:  Procedure Laterality Date  . CATARACT EXTRACTION    . ROTATOR CUFF REPAIR     bilat   There are no active problems to display for this patient.     Prior to Admission medications   Medication Sig Start Date End Date Taking? Authorizing Provider  acetaminophen-codeine (TYLENOL #3) 300-30 MG tablet Take 1 tablet by mouth every 4 (four) hours as needed for moderate pain.    [provider]  albuterol (PROVENTIL HFA;VENTOLIN HFA) 108 (90 BASE) MCG/ACT inhaler Inhale into the lungs every 6 (six) hours as needed for wheezing or shortness of breath.    [provider]  amLODipine (NORVASC) 10 MG tablet Take 10 mg by mouth daily.    [provider]  budesonide-formoterol (SYMBICORT) 160-4.5 MCG/ACT inhaler Inhale 2 puffs into the lungs 2 (two) times daily.    [provider]  carvedilol (COREG) 12.5 MG tablet Take 12.5 mg by mouth 2 (two) times daily with a meal.    [provider]  gabapentin (NEURONTIN) 400 MG capsule Take 400 mg by mouth 3 (three) times daily as needed (pain).     [provider]  methocarbamol (ROBAXIN) 500 MG tablet Take 500 mg by mouth 3 (three) times daily.     [provider]  omeprazole (PRILOSEC) 20 MG capsule Take 20 mg by mouth daily.    [provider]  pseudoephedrine (SUDAFED) 60 MG tablet Take 60 mg by mouth every 6 (six) hours as needed. For sinus pressure.     [provider]  simvastatin (ZOCOR) 20 MG tablet Take 20 mg by mouth daily.    [provider]  traZODone (DESYREL) 150 MG tablet Take 300 mg by mouth at bedtime.     [provider]    Allergies Patient has no known allergies.    Social History Social History  Substance Use Topics  . Smoking status: Current Every Day Smoker    Types: Cigarettes  . Smokeless tobacco: Never Used  . Alcohol use No    Review of Systems Patient denies headaches, rhinorrhea, blurry vision, numbness, shortness of breath, chest pain, edema, cough, abdominal pain, nausea, vomiting, diarrhea, dysuria, fevers, rashes or hallucinations unless otherwise stated above in HPI. ____________________________________________   PHYSICAL EXAM:  VITAL SIGNS: Vitals:   11/29/16 1337 11/29/16 1605  BP: (!) 142/55 118/68  Pulse:  64  Resp: 16 18  Temp:  97.7 F (36.5 C)    Constitutional: Alert and oriented. in no acute distress. Eyes: Conjunctivae are normal.  Head: Atraumatic. Nose: No congestion/rhinnorhea. Mouth/Throat: Mucous membranes are moist.   Neck: No stridor. Painless ROM.  Cardiovascular: Normal rate, regular rhythm. Grossly normal heart sounds.  Good peripheral circulation. Respiratory: Normal respiratory effort.  No retractions. Lungs CTAB. Gastrointestinal: Soft. No obvious hernia noted,  There is some RLQ  ttp however, no guarding or rebound. No distention. No abdominal bruits. No CVA tenderness. Musculoskeletal: No lower extremity tenderness nor edema.  No joint effusions. Neurologic:  Normal speech and language. No gross focal neurologic deficits are appreciated. No facial droop Skin:  Skin is warm, dry and intact. No rash noted. Psychiatric: Mood and affect are normal. Speech and behavior are normal.  ____________________________________________   LABS (all labs ordered are listed, but only abnormal results are displayed)  Results for orders placed or performed during the hospital encounter of 11/29/16 (from the past 24 hour(s))  Lipase, blood     Status: None   Collection Time: 11/29/16  1:37 PM  Result Value Ref Range   Lipase 26 11 - 51 U/L  Comprehensive metabolic panel     Status: Abnormal   Collection Time: 11/29/16  1:37 PM  Result Value Ref Range   Sodium 136 135 - 145 mmol/L   Potassium 4.7 3.5 - 5.1 mmol/L   Chloride 102 101 - 111 mmol/L   CO2 25 22 - 32 mmol/L   Glucose, Bld 181 (H) 65 - 99 mg/dL   BUN 25 (H) 6 - 20 mg/dL   Creatinine, Ser 4.091.36 (H) 0.61 - 1.24 mg/dL   Calcium 9.2 8.9 - 81.110.3 mg/dL   Total Protein 7.0 6.5 - 8.1 g/dL   Albumin 4.3 3.5 - 5.0 g/dL   AST 28 15 - 41 U/L   ALT 21 17 - 63 U/L   Alkaline Phosphatase 49 38 - 126 U/L   Total Bilirubin 1.3 (H) 0.3 - 1.2 mg/dL   GFR calc non Af Amer 52 (L) >60 mL/min   GFR calc Af Amer 60 (L) >60 mL/min   Anion gap 9 5 - 15  CBC     Status: None   Collection Time: 11/29/16  1:37 PM  Result Value Ref Range   WBC 9.3 3.8 - 10.6 K/uL   RBC 4.48 4.40 - 5.90 MIL/uL   Hemoglobin 15.0 13.0 - 18.0 g/dL   HCT 91.444.3 78.240.0 - 95.652.0 %   MCV 98.8 80.0 - 100.0 fL   MCH 33.4 26.0 - 34.0 pg   MCHC 33.8 32.0 - 36.0 g/dL   RDW 21.313.6 08.611.5 - 57.814.5 %   Platelets 227 150 - 440 K/uL   ____________________________________________  EKG____________________________________________  RADIOLOGY  I personally reviewed all  radiographic images ordered to evaluate for the above acute complaints and reviewed radiology reports and findings.  These findings were personally discussed with the patient.  Please see medical record for radiology report.  ____________________________________________   PROCEDURES  Procedure(s) performed:  Procedures    Critical Care performed: no ____________________________________________   INITIAL IMPRESSION / ASSESSMENT AND PLAN / ED COURSE  Pertinent labs & imaging results that were available during my care of the patient were reviewed by me and considered in my medical decision making (see chart for details).  DDX: hernia, msk strain, stone, colitis, dehydration,  Marlowe AschoffJames D Kubin is a 71  y.o. who presents to the ED with diarrhea and abdominal pain as described above. Patient does appear mildly dehydrated but not critically so. Blood work does show mild dehydration. However there is no evidence of metabolic acidosis. There is no evidence of leukocytosis or acute blood loss anemia. Visit on exam does not reveal any evidence of hernia or overlying cellulitis or erythema. Based on his pain will order CT imaging to further evaluate for the above differential.  Clinical Course as of Nov 30 1615  Tue Nov 29, 2016  1555 CT imaging discussed with family. Importantly there is no evidence of strength related or incarcerated hernia as was expected by physical exam. There is also no evidence of acute appendicitis diverticulitis or perforation. Patient was given IV fluid bolus. Remained hemodynamically stable. blood work is otherwise reassuring.  At this point he feel the patient will be stable and appropriate for further workup as an outpatient.  Patient was able to tolerate PO and was able to ambulate with a steady gait.  Have discussed with the patient and available family all diagnostics and treatments performed thus far and all questions were answered to the best of my ability. The  patient demonstrates understanding and agreement with plan.    [PR]    Clinical Course User Index [PR] Willy Eddyobinson, Eduar Kumpf, MD     ____________________________________________   FINAL CLINICAL IMPRESSION(S) / ED DIAGNOSES  Final diagnoses:  Diarrhea, unspecified type  Dehydration      NEW MEDICATIONS STARTED DURING THIS VISIT:  New Prescriptions   No medications on file     Note:  This document was prepared using Dragon voice recognition software and may include unintentional dictation errors.    Willy Eddyobinson, Baylin Gamblin, MD 11/29/16 [phone]

## 2016-11-29 NOTE — ED Triage Notes (Signed)
Pt reports hernia "is acting up again" and has weakness due to diarrhea.

## 2016-11-29 NOTE — Discharge Instructions (Signed)

## 2016-12-07 ENCOUNTER — Telehealth: Payer: Self-pay | Admitting: Surgery

## 2016-12-07 NOTE — Telephone Encounter (Signed)
I have called patient to make an appointment for ED Follow-up (7/31): Right Inguinal Hernia. No answer. I have left a message on voicemail. Please schedule patient an appointment with any available surgeon.

## 2016-12-14 ENCOUNTER — Encounter: Payer: Self-pay | Admitting: Surgery

## 2016-12-14 NOTE — Telephone Encounter (Signed)
I have tried contacting patient again. No answer. I have left another voicemail for patient to call to make an ED follow up visit.   No contact. I have mailed a letter. Letter is in Epic.

## 2020-01-21 ENCOUNTER — Encounter: Payer: Self-pay | Admitting: Radiology

## 2020-01-21 ENCOUNTER — Emergency Department: Payer: Medicare Other

## 2020-01-21 ENCOUNTER — Inpatient Hospital Stay
Admission: EM | Admit: 2020-01-21 | Discharge: 2020-01-23 | DRG: 291 | Discharge status: Against Medical Advice | Payer: Medicare Other | Attending: Internal Medicine | Admitting: Internal Medicine

## 2020-01-21 DIAGNOSIS — R0602 Shortness of breath: Secondary | ICD-10-CM

## 2020-01-21 DIAGNOSIS — Z66 Do not resuscitate: Secondary | ICD-10-CM | POA: Diagnosis present

## 2020-01-21 DIAGNOSIS — I248 Other forms of acute ischemic heart disease: Secondary | ICD-10-CM | POA: Diagnosis present

## 2020-01-21 DIAGNOSIS — I10 Essential (primary) hypertension: Secondary | ICD-10-CM | POA: Diagnosis not present

## 2020-01-21 DIAGNOSIS — J810 Acute pulmonary edema: Secondary | ICD-10-CM

## 2020-01-21 DIAGNOSIS — R7989 Other specified abnormal findings of blood chemistry: Secondary | ICD-10-CM | POA: Diagnosis not present

## 2020-01-21 DIAGNOSIS — Z5329 Procedure and treatment not carried out because of patient's decision for other reasons: Secondary | ICD-10-CM | POA: Diagnosis present

## 2020-01-21 DIAGNOSIS — I110 Hypertensive heart disease with heart failure: Secondary | ICD-10-CM | POA: Diagnosis present

## 2020-01-21 DIAGNOSIS — I4891 Unspecified atrial fibrillation: Secondary | ICD-10-CM | POA: Diagnosis present

## 2020-01-21 DIAGNOSIS — I5031 Acute diastolic (congestive) heart failure: Secondary | ICD-10-CM | POA: Diagnosis present

## 2020-01-21 DIAGNOSIS — Z720 Tobacco use: Secondary | ICD-10-CM | POA: Insufficient documentation

## 2020-01-21 DIAGNOSIS — Z7951 Long term (current) use of inhaled steroids: Secondary | ICD-10-CM

## 2020-01-21 DIAGNOSIS — J441 Chronic obstructive pulmonary disease with (acute) exacerbation: Secondary | ICD-10-CM | POA: Diagnosis present

## 2020-01-21 DIAGNOSIS — J9601 Acute respiratory failure with hypoxia: Secondary | ICD-10-CM | POA: Diagnosis present

## 2020-01-21 DIAGNOSIS — Z79899 Other long term (current) drug therapy: Secondary | ICD-10-CM

## 2020-01-21 DIAGNOSIS — R945 Abnormal results of liver function studies: Secondary | ICD-10-CM | POA: Diagnosis not present

## 2020-01-21 DIAGNOSIS — K761 Chronic passive congestion of liver: Secondary | ICD-10-CM | POA: Diagnosis present

## 2020-01-21 DIAGNOSIS — F17210 Nicotine dependence, cigarettes, uncomplicated: Secondary | ICD-10-CM | POA: Diagnosis present

## 2020-01-21 DIAGNOSIS — E8770 Fluid overload, unspecified: Secondary | ICD-10-CM | POA: Diagnosis not present

## 2020-01-21 DIAGNOSIS — Z8249 Family history of ischemic heart disease and other diseases of the circulatory system: Secondary | ICD-10-CM | POA: Diagnosis not present

## 2020-01-21 DIAGNOSIS — E872 Acidosis: Secondary | ICD-10-CM | POA: Diagnosis not present

## 2020-01-21 DIAGNOSIS — E785 Hyperlipidemia, unspecified: Secondary | ICD-10-CM | POA: Insufficient documentation

## 2020-01-21 DIAGNOSIS — Z820 Family history of epilepsy and other diseases of the nervous system: Secondary | ICD-10-CM

## 2020-01-21 DIAGNOSIS — I509 Heart failure, unspecified: Secondary | ICD-10-CM | POA: Insufficient documentation

## 2020-01-21 DIAGNOSIS — E7800 Pure hypercholesterolemia, unspecified: Secondary | ICD-10-CM | POA: Diagnosis present

## 2020-01-21 DIAGNOSIS — Z20822 Contact with and (suspected) exposure to covid-19: Secondary | ICD-10-CM | POA: Diagnosis present

## 2020-01-21 LAB — CBC WITH DIFFERENTIAL/PLATELET
Abs Immature Granulocytes: 0.05 10*3/uL (ref 0.00–0.07)
Basophils Absolute: 0.1 10*3/uL (ref 0.0–0.1)
Basophils Relative: 1 %
Eosinophils Absolute: 0 10*3/uL (ref 0.0–0.5)
Eosinophils Relative: 0 %
HCT: 40.1 % (ref 39.0–52.0)
Hemoglobin: 13.9 g/dL (ref 13.0–17.0)
Immature Granulocytes: 1 %
Lymphocytes Relative: 9 %
Lymphs Abs: 0.9 10*3/uL (ref 0.7–4.0)
MCH: 32.7 pg (ref 26.0–34.0)
MCHC: 34.7 g/dL (ref 30.0–36.0)
MCV: 94.4 fL (ref 80.0–100.0)
Monocytes Absolute: 0.7 10*3/uL (ref 0.1–1.0)
Monocytes Relative: 7 %
Neutro Abs: 8.1 10*3/uL — ABNORMAL HIGH (ref 1.7–7.7)
Neutrophils Relative %: 82 %
Platelets: 269 10*3/uL (ref 150–400)
RBC: 4.25 MIL/uL (ref 4.22–5.81)
RDW: 15.6 % — ABNORMAL HIGH (ref 11.5–15.5)
WBC: 9.9 10*3/uL (ref 4.0–10.5)
nRBC: 0 % (ref 0.0–0.2)

## 2020-01-21 LAB — COMPREHENSIVE METABOLIC PANEL
ALT: 66 U/L — ABNORMAL HIGH (ref 0–44)
AST: 59 U/L — ABNORMAL HIGH (ref 15–41)
Albumin: 4.1 g/dL (ref 3.5–5.0)
Alkaline Phosphatase: 75 U/L (ref 38–126)
Anion gap: 13 (ref 5–15)
BUN: 15 mg/dL (ref 8–23)
CO2: 21 mmol/L — ABNORMAL LOW (ref 22–32)
Calcium: 8.7 mg/dL — ABNORMAL LOW (ref 8.9–10.3)
Chloride: 105 mmol/L (ref 98–111)
Creatinine, Ser: 1.15 mg/dL (ref 0.61–1.24)
GFR calc Af Amer: >60 mL/min (ref >60)
GFR calc non Af Amer: >60 mL/min (ref >60)
Glucose, Bld: 125 mg/dL — ABNORMAL HIGH (ref 70–99)
Potassium: 4.6 mmol/L (ref 3.5–5.1)
Sodium: 139 mmol/L (ref 135–145)
Total Bilirubin: 1.5 mg/dL — ABNORMAL HIGH (ref 0.3–1.2)
Total Protein: 7.3 g/dL (ref 6.5–8.1)

## 2020-01-21 LAB — BLOOD GAS, VENOUS
Acid-base deficit: 0.6 mmol/L (ref 0.0–2.0)
Bicarbonate: 23.2 mmol/L (ref 20.0–28.0)
O2 Saturation: 65.8 %
Patient temperature: 37
pCO2, Ven: 35 mmHg — ABNORMAL LOW (ref 44.0–60.0)
pH, Ven: 7.43 (ref 7.250–7.430)
pO2, Ven: 33 mmHg (ref 32.0–45.0)

## 2020-01-21 LAB — TROPONIN I (HIGH SENSITIVITY)
Troponin I (High Sensitivity): 254 ng/L (ref <18)
Troponin I (High Sensitivity): 270 ng/L (ref <18)

## 2020-01-21 LAB — FIBRIN DERIVATIVES D-DIMER (ARMC ONLY): Fibrin derivatives D-dimer (ARMC): 6875.27 ng/mL (FEU) — ABNORMAL HIGH (ref 0.00–499.00)

## 2020-01-21 LAB — SARS CORONAVIRUS 2 BY RT PCR (HOSPITAL ORDER, PERFORMED IN ~~LOC~~ HOSPITAL LAB): SARS Coronavirus 2: NEGATIVE

## 2020-01-21 LAB — BRAIN NATRIURETIC PEPTIDE: B Natriuretic Peptide: 1455 pg/mL — ABNORMAL HIGH (ref 0.0–100.0)

## 2020-01-21 MED ORDER — SODIUM CHLORIDE 0.9% FLUSH: 3.0000 mL | INTRAVENOUS | Status: DC | PRN

## 2020-01-21 MED ORDER — SODIUM CHLORIDE 0.9% FLUSH
3.0000 mL | Freq: Two times a day (BID) | INTRAVENOUS | Status: DC
  Administered 2020-01-22: 3 mL via INTRAVENOUS

## 2020-01-21 MED ORDER — SODIUM CHLORIDE 0.9 % IV SOLN: 250.0000 mL | INTRAVENOUS | Status: DC | PRN

## 2020-01-21 MED ORDER — ACETAMINOPHEN 325 MG PO TABS: 650.0000 mg | ORAL_TABLET | ORAL | Status: DC | PRN

## 2020-01-21 MED ORDER — IPRATROPIUM-ALBUTEROL 0.5-2.5 (3) MG/3ML IN SOLN
3.0000 mL | Freq: Once | RESPIRATORY_TRACT | Status: AC
  Administered 2020-01-21: 3 mL via RESPIRATORY_TRACT
  Filled 2020-01-21: qty 3

## 2020-01-21 MED ORDER — SODIUM CHLORIDE 0.9 % IV SOLN
500.0000 mg | Freq: Once | INTRAVENOUS | Status: AC
  Administered 2020-01-21: 500 mg via INTRAVENOUS
  Filled 2020-01-21: qty 500

## 2020-01-21 MED ORDER — ALPRAZOLAM 0.25 MG PO TABS
0.2500 mg | ORAL_TABLET | Freq: Three times a day (TID) | ORAL | Status: DC | PRN
  Filled 2020-01-21: qty 1

## 2020-01-21 MED ORDER — NICOTINE 21 MG/24HR TD PT24
21.0000 mg | MEDICATED_PATCH | Freq: Every day | TRANSDERMAL | Status: DC
  Administered 2020-01-22 – 2020-01-23 (×2): 21 mg via TRANSDERMAL
  Filled 2020-01-21 (×2): qty 1

## 2020-01-21 MED ORDER — SODIUM CHLORIDE 0.9 % IV BOLUS
1000.0000 mL | Freq: Once | INTRAVENOUS | Status: AC
  Administered 2020-01-21: 1000 mL via INTRAVENOUS

## 2020-01-21 MED ORDER — GABAPENTIN 400 MG PO CAPS
800.0000 mg | ORAL_CAPSULE | Freq: Three times a day (TID) | ORAL | Status: DC
  Administered 2020-01-21 – 2020-01-23 (×5): 800 mg via ORAL
  Filled 2020-01-21 (×7): qty 2
  Filled 2020-01-21: qty 8

## 2020-01-21 MED ORDER — DILTIAZEM HCL 25 MG/5ML IV SOLN
15.0000 mg | Freq: Once | INTRAVENOUS | Status: AC
  Administered 2020-01-21: 15 mg via INTRAVENOUS

## 2020-01-21 MED ORDER — MOMETASONE FURO-FORMOTEROL FUM 200-5 MCG/ACT IN AERO
2.0000 | INHALATION_SPRAY | Freq: Two times a day (BID) | RESPIRATORY_TRACT | Status: DC
  Administered 2020-01-21 – 2020-01-22 (×2): 2 via RESPIRATORY_TRACT
  Filled 2020-01-21: qty 8.8

## 2020-01-21 MED ORDER — METHOCARBAMOL 750 MG PO TABS
750.0000 mg | ORAL_TABLET | Freq: Three times a day (TID) | ORAL | Status: DC | PRN
  Filled 2020-01-21: qty 1

## 2020-01-21 MED ORDER — LORAZEPAM 2 MG/ML IJ SOLN: 0.5000 mg | Freq: Once | INTRAMUSCULAR | Status: AC

## 2020-01-21 MED ORDER — FUROSEMIDE 10 MG/ML IJ SOLN
60.0000 mg | Freq: Once | INTRAMUSCULAR | Status: AC
  Administered 2020-01-21: 60 mg via INTRAVENOUS
  Filled 2020-01-21: qty 8

## 2020-01-21 MED ORDER — METHYLPREDNISOLONE SODIUM SUCC 125 MG IJ SOLR
125.0000 mg | Freq: Once | INTRAMUSCULAR | Status: AC
  Administered 2020-01-21: 125 mg via INTRAVENOUS
  Filled 2020-01-21: qty 2

## 2020-01-21 MED ORDER — SODIUM CHLORIDE 0.9 % IV SOLN
2.0000 g | INTRAVENOUS | Status: DC
  Administered 2020-01-21 – 2020-01-22 (×2): 2 g via INTRAVENOUS
  Filled 2020-01-21 (×3): qty 20

## 2020-01-21 MED ORDER — METHYLPREDNISOLONE SODIUM SUCC 40 MG IJ SOLR
40.0000 mg | Freq: Two times a day (BID) | INTRAMUSCULAR | Status: DC
  Administered 2020-01-22 – 2020-01-23 (×3): 40 mg via INTRAVENOUS
  Filled 2020-01-21 (×3): qty 1

## 2020-01-21 MED ORDER — CARVEDILOL 12.5 MG PO TABS
12.5000 mg | ORAL_TABLET | Freq: Two times a day (BID) | ORAL | Status: DC
  Administered 2020-01-21 – 2020-01-23 (×4): 12.5 mg via ORAL
  Filled 2020-01-21: qty 1
  Filled 2020-01-21 (×3): qty 2

## 2020-01-21 MED ORDER — FUROSEMIDE 10 MG/ML IJ SOLN
40.0000 mg | Freq: Two times a day (BID) | INTRAMUSCULAR | Status: DC
  Administered 2020-01-22 – 2020-01-23 (×3): 40 mg via INTRAVENOUS
  Filled 2020-01-21 (×3): qty 4

## 2020-01-21 MED ORDER — ACETAMINOPHEN-CODEINE #3 300-30 MG PO TABS: 1.0000 | ORAL_TABLET | Freq: Four times a day (QID) | ORAL | Status: DC | PRN

## 2020-01-21 MED ORDER — ENOXAPARIN SODIUM 60 MG/0.6ML ~~LOC~~ SOLN
1.0000 mg/kg | Freq: Two times a day (BID) | SUBCUTANEOUS | Status: DC
  Administered 2020-01-22 – 2020-01-23 (×3): 60 mg via SUBCUTANEOUS
  Filled 2020-01-21 (×4): qty 0.6

## 2020-01-21 MED ORDER — SODIUM CHLORIDE 0.9 % IV SOLN: 2.0000 g | Freq: Once | INTRAVENOUS | Status: DC

## 2020-01-21 MED ORDER — LORAZEPAM 2 MG/ML IJ SOLN
INTRAMUSCULAR | Status: AC
  Administered 2020-01-21: 0.5 mg via INTRAVENOUS
  Filled 2020-01-21: qty 1

## 2020-01-21 MED ORDER — IOHEXOL 350 MG/ML SOLN
75.0000 mL | Freq: Once | INTRAVENOUS | Status: AC | PRN
  Administered 2020-01-21: 75 mL via INTRAVENOUS

## 2020-01-21 MED ORDER — TRAZODONE HCL 100 MG PO TABS
200.0000 mg | ORAL_TABLET | Freq: Every day | ORAL | Status: DC
  Administered 2020-01-21 – 2020-01-22 (×2): 200 mg via ORAL
  Filled 2020-01-21 (×2): qty 2

## 2020-01-21 MED ORDER — ONDANSETRON HCL 4 MG/2ML IJ SOLN: 4.0000 mg | Freq: Four times a day (QID) | INTRAMUSCULAR | Status: DC | PRN

## 2020-01-21 MED ORDER — DILTIAZEM HCL-DEXTROSE 125-5 MG/125ML-% IV SOLN (PREMIX)
5.0000 mg/h | INTRAVENOUS | Status: DC
  Administered 2020-01-21: 5 mg/h via INTRAVENOUS
  Administered 2020-01-22: 10 mg/h via INTRAVENOUS
  Filled 2020-01-21 (×3): qty 125

## 2020-01-21 MED ORDER — DICLOFENAC SODIUM 1 % EX GEL
2.0000 g | Freq: Four times a day (QID) | CUTANEOUS | Status: DC | PRN
  Filled 2020-01-21: qty 100

## 2020-01-21 MED ORDER — LOSARTAN POTASSIUM 25 MG PO TABS
25.0000 mg | ORAL_TABLET | Freq: Every day | ORAL | Status: DC
  Administered 2020-01-21 – 2020-01-23 (×3): 25 mg via ORAL
  Filled 2020-01-21 (×3): qty 1

## 2020-01-21 MED ORDER — MIRTAZAPINE 15 MG PO TABS
30.0000 mg | ORAL_TABLET | Freq: Every day | ORAL | Status: DC
  Administered 2020-01-21 – 2020-01-22 (×2): 30 mg via ORAL
  Filled 2020-01-21 (×2): qty 2

## 2020-01-21 MED ORDER — MAGNESIUM SULFATE 2 GM/50ML IV SOLN
2.0000 g | Freq: Once | INTRAVENOUS | Status: AC
  Administered 2020-01-21: 2 g via INTRAVENOUS
  Filled 2020-01-21: qty 50

## 2020-01-21 MED ORDER — IPRATROPIUM-ALBUTEROL 0.5-2.5 (3) MG/3ML IN SOLN
RESPIRATORY_TRACT | Status: AC
  Filled 2020-01-21: qty 3

## 2020-01-21 MED ORDER — ASPIRIN EC 81 MG PO TBEC
81.0000 mg | DELAYED_RELEASE_TABLET | Freq: Every day | ORAL | Status: DC
  Administered 2020-01-21 – 2020-01-23 (×3): 81 mg via ORAL
  Filled 2020-01-21 (×3): qty 1

## 2020-01-21 MED ORDER — NITROGLYCERIN 2 % TD OINT
1.0000 [in_us] | TOPICAL_OINTMENT | Freq: Once | TRANSDERMAL | Status: AC
  Administered 2020-01-21: 1 [in_us] via TOPICAL
  Filled 2020-01-21: qty 1

## 2020-01-21 NOTE — ED Notes (Signed)
Date and time results received: 01/21/20 1845 (use smartphrase ".now" to insert current time)  Test: Troponin  Critical Value: 254 / 270  Name of Provider Notified: Dr. Renae Gloss   Orders Received? Or Actions Taken?: No new orders at this time

## 2020-01-21 NOTE — ED Notes (Signed)
This RN and [employer] change the patient's linens, gown, and chux at this time. New sheets placed on bed and clean/dry chux, male purwick placed on patient at this time with permission, patient placed back in bed with two clean/dry/warm blankets. Pt appears more comfortable in bed at this time, no further needs noted. Pt tolerating bipap well at this time.

## 2020-01-21 NOTE — ED Notes (Signed)
Respiratory called again per MD to ask about neb tx attachment. Respiratory stating same thing as before and will not place attachment. MD made aware.

## 2020-01-21 NOTE — ED Notes (Addendum)
This RN at bedside. Pt took his bipap off. Pt still diaphoretic with accessory muscle use. This RN stressed the importance of keeping it on. Mask placed back on pt at this time

## 2020-01-21 NOTE — ED Notes (Addendum)
This RN at bedside. Pt continues to take his bipap off. Pt has been tolder numerous times by numerous different staff that he needs to keep the BiPAP on that is helping for air into his lungs.

## 2020-01-21 NOTE — ED Notes (Signed)
This RN at bedside. Pt pulled bipap off and oxygen sats 87%. Pt diaphoretic and stating he feels claustrophobic again. MD messaged

## 2020-01-21 NOTE — ED Notes (Signed)
Pt hunched over and breathing heavy with accessory muscle use and labored breathing. Pt had taken off O2. MD Jessup at bedside and placed pt back on [place]. Pt states "I  feel like I am going to pass out". Pt's bed pulled up more upright. Pt placed back on BIPAP by MD Jessup. Verbal order for duoneb given and duoneb given through pt's BIPAP.  Pt continues to struggle to breath. Pt instructed to take slow deep breaths.  RN Tresa Endo informed of all events that just took place.

## 2020-01-21 NOTE — ED Triage Notes (Signed)
Pt to ED via [facility] from home. Per EMS pt c/o SOB x2 wks that has gotten worse today. Pt placed on monitor and reading afib RVR.  Upon arrival pt with accessory muscle use and grey in color and diaphoretic. Pt placed on 3L [place] for comfort. Pt with hx COPD and irregular HR but is unsure if it was afib. Respiratory called for BiPAP.

## 2020-01-21 NOTE — H&P (Signed)
[facility] [place] [facility]   PATIENT NAME: Ross Fisher    MR#:  [id number]  DATE OF BIRTH:  August 12, 1945  DATE OF ADMISSION:  01/21/2020  PRIMARY CARE PHYSICIAN: Charolett Bumpers, PA-C   REQUESTING/REFERRING PHYSICIAN:   CHIEF COMPLAINT:   Chief Complaint  Patient presents with  . Atrial Fibrillation    RVR    HISTORY OF PRESENT ILLNESS:  Ross Fisher  is a 74 y.o. male coming in with a few weeks of shortness of breath.  Patient states he cannot breathe.  Slight cough.  Slight nausea but no vomiting.  No chest pain.  No palpitations.  In the ER he was found to be in rapid atrial fibrillation and congestive heart failure.  His D-dimer was elevated so he did a CAT scan of the chest which was negative for pulmonary embolism.  Patient was placed on BiPAP secondary to respiratory distress.  Patient feeling slightly better after placed on BiPAP.  Hospitalist services were contacted for further evaluation.  PAST MEDICAL HISTORY:   Past Medical History:  Diagnosis Date  . Back pain   . COPD (chronic obstructive pulmonary disease) (HCC)   . High cholesterol   . Hypercholesteremia   . Hypertension     PAST SURGICAL HISTORY:   Past Surgical History:  Procedure Laterality Date  . CATARACT EXTRACTION    . ROTATOR CUFF REPAIR     bilat    SOCIAL HISTORY:   Social History   Tobacco Use  . Smoking status: Current Every Day Smoker    Types: Cigarettes  . Smokeless tobacco: Never Used  Substance Use Topics  . Alcohol use: No    FAMILY HISTORY:   Family History  Problem Relation Age of Onset  . CAD Mother   . Alzheimer's disease Father     DRUG ALLERGIES:  No Known Allergies  REVIEW OF SYSTEMS:  CONSTITUTIONAL: No fever, chills or sweats.  EYES: No blurred or double vision.  EARS, NOSE, AND THROAT: No tinnitus or ear pain. No sore throat RESPIRATORY: Positive cough, positive for shortness of breath, some wheezing.  CARDIOVASCULAR: No  chest pain.  GASTROINTESTINAL: Some nausea.no vomiting, diarrhea or abdominal pain. No blood in bowel movements GENITOURINARY: No dysuria, hematuria.  ENDOCRINE: No polyuria, nocturia,  HEMATOLOGY: No anemia, easy bruising or bleeding SKIN: No rash or lesion. MUSCULOSKELETAL: No joint pain or arthritis.   NEUROLOGIC: No tingling, numbness, weakness.  PSYCHIATRY: No anxiety or depression.   MEDICATIONS AT HOME:   Prior to Admission medications   Medication Sig Start Date End Date Taking? Authorizing Provider  acetaminophen-codeine (TYLENOL #3) 300-30 MG tablet Take 1 tablet by mouth 4 (four) times daily as needed for moderate pain.    Yes [provider]  albuterol (PROVENTIL HFA;VENTOLIN HFA) 108 (90 BASE) MCG/ACT inhaler Inhale 1 puff into the lungs every 6 (six) hours as needed for wheezing or shortness of breath.    Yes [provider]  amLODipine (NORVASC) 10 MG tablet Take 10 mg by mouth daily.   Yes [provider]  budesonide-formoterol (SYMBICORT) 160-4.5 MCG/ACT inhaler Inhale 2 puffs into the lungs 2 (two) times daily.   Yes [provider]  carvedilol (COREG) 12.5 MG tablet Take 12.5 mg by mouth 2 (two) times daily with a meal.   Yes [provider]  diclofenac Sodium (VOLTAREN) 1 % GEL Apply 2 g topically 4 (four) times daily as needed (pain).   Yes [provider]  gabapentin (NEURONTIN) 400  MG capsule Take 800 mg by mouth every 8 (eight) hours.    Yes [provider]  methocarbamol (ROBAXIN) 750 MG tablet Take 750 mg by mouth every 8 (eight) hours as needed for muscle spasms.    Yes [provider]  mirtazapine (REMERON) 30 MG tablet Take 30 mg by mouth at bedtime.   Yes [provider]  simvastatin (ZOCOR) 40 MG tablet Take 20 mg by mouth daily.    Yes [provider]  traZODone (DESYREL) 100 MG tablet Take 200 mg by mouth at bedtime.    Yes [provider]   triprolidine-pseudoephedrine (APRODINE) 2.5-60 MG TABS tablet Take 1 tablet by mouth every 6 (six) hours as needed for allergies.   Yes [provider]      VITAL SIGNS:  Blood pressure (!) 161/87, pulse (!) 117, temperature (!) 97.4 F (36.3 C), temperature source Oral, resp. rate 20, SpO2 97 %.  PHYSICAL EXAMINATION:  GENERAL:  74 y.o.-year-old patient lying in the bed with no acute distress.  EYES: Pupils equal, round, reactive to light and accommodation. No scleral icterus.  HEENT: Head atraumatic, normocephalic. Oropharynx and nasopharynx clear.  NECK:  Supple. No thyroid enlargement.  LUNGS: Decreased breath sounds bilaterally, no wheezing, positive rales at the bases. No use of accessory muscles of respiration.  CARDIOVASCULAR: S1, S2 irregularly irregular tachycardic. No murmurs, rubs, or gallops.  ABDOMEN: Soft, nontender, nondistended. Bowel sounds present. No organomegaly or mass.  EXTREMITIES: No pedal edema.  NEUROLOGIC: Cranial nerves II through XII are intact. Muscle strength 5/5 in all extremities. Sensation intact. Gait not checked.  PSYCHIATRIC: The patient is alert and oriented x 3.  SKIN: No rash, lesion, or ulcer.   LABORATORY PANEL:   CBC Recent Labs  Lab 01/21/20 1358  WBC 9.9  HGB 13.9  HCT 40.1  PLT 269   ------------------------------------------------------------------------------------------------------------------  Chemistries  Recent Labs  Lab 01/21/20 1358  NA 139  K 4.6  CL 105  CO2 21*  GLUCOSE 125*  BUN 15  CREATININE 1.15  CALCIUM 8.7*  AST 59*  ALT 66*  ALKPHOS 75  BILITOT 1.5*   ------------------------------------------------------------------------------------------------------------------    RADIOLOGY:  CT Angio Chest PE W and/or Wo Contrast  Result Date: 01/21/2020 CLINICAL DATA:  Shortness of breath.  AFib with RVR. EXAM: CT ANGIOGRAPHY CHEST WITH CONTRAST TECHNIQUE: Multidetector CT imaging of the chest  was performed using the standard protocol during bolus administration of intravenous contrast. Multiplanar CT image reconstructions and MIPs were obtained to evaluate the vascular anatomy. CONTRAST:  51mL OMNIPAQUE IOHEXOL 350 MG/ML SOLN COMPARISON:  June 20, 2016. FINDINGS: Cardiovascular: Satisfactory opacification of the pulmonary arteries to the segmental level. No evidence of pulmonary embolism. Limited evaluation secondary to respiratory motion. Mild cardiomegaly. No pericardial effusion. Three-vessel coronary artery atherosclerotic calcifications. Aortic valve calcifications. Moderate to severe atherosclerotic calcifications throughout the aorta. Mediastinum/Nodes: No suspicious mediastinal lymph nodes. No suspicious axillary lymph nodes. Thyroid is unremarkable. Lungs/Pleura: Centrilobular emphysema. There is diffuse interlobular septal thickening. There is bronchial wall thickening. Small bilateral pleural effusions. There is more confluent bibasilar consolidative and ground-glass opacities in the dependent lungs. Unchanged RIGHT apical greater than LEFT apical scarring. Scattered debris within the trachea and bronchi. Evaluation for fine parenchymal detail is limited secondary to extensive respiratory motion. Upper Abdomen: No acute abnormality. Musculoskeletal: No chest wall abnormality. No acute or significant osseous findings. Unchanged sclerotic lesion of the LEFT posterior seventh rib, likely a bone island. Review of the MIP images confirms the above  findings. IMPRESSION: 1. No evidence of acute pulmonary embolism. 2. Small bilateral pleural effusions with diffuse interlobular septal thickening and bronchial wall thickening, consistent with pulmonary edema. More confluent bibasilar consolidative and ground-glass opacities may reflect aspiration or infection. Aortic Atherosclerosis (ICD10-I70.0) and Emphysema (ICD10-J43.9). Electronically Signed   By: Meda Klinefelter MD   On: 01/21/2020 15:56    DG Chest Portable 1 View  Result Date: 01/21/2020 CLINICAL DATA:  Shortness of breath. EXAM: PORTABLE CHEST 1 VIEW COMPARISON:  June 20, 2016. FINDINGS: Stable cardiomediastinal silhouette. No pneumothorax is noted. Diffuse interstitial densities are noted throughout both lungs concerning for pulmonary edema. Minimal pleural effusions may be present. Bony thorax is unremarkable. IMPRESSION: Findings consistent with bilateral pulmonary edema. Electronically Signed   By: Lupita Raider M.D.   On: 01/21/2020 14:16    EKG:   Atrial fibrillation with rapid ventricular response 149 bpm nonspecific ST-T wave changes.  IMPRESSION AND PLAN:   1.  Acute congestive heart failure.  No prior echocardiogram to determine ejection fraction.  Patient ordered 60 mg of Lasix in the emergency room.  I will continue 40 mg IV twice daily.  Patient already on Coreg.  I will add Cozaar.  Echocardiogram and cardiology consultation ordered. 2.  Acute hypoxic respiratory failure.  Patient currently breathing better on BiPAP 40% FiO2.  Hopefully can taper off BiPAP shortly.  Initial Covid test negative. 3.  Atrial fibrillation with rapid ventricular response.  Patient on Cardizem drip.  We will add oral Coreg and hopefully can come off Cardizem drip.  Check a TSH.  ER physician ordered IV magnesium.  Placed on high-dose Lovenox. 4.  Elevated D-dimer.  CT scan of the chest negative for pulmonary embolism 5.  COPD exacerbation with possible pneumonia.  Send off procalcitonin.  Start Rocephin and Zithromax. 6.  Essential hypertension on Coreg 7.  Hyperlipidemia hold Zocor while on Cardizem drip 8.  Tobacco abuse nicotine patch ordered 9.  Patient is a DO NOT RESUSCITATE    All the laboratory data and radiological data and records are reviewed and case discussed with ED provider. Management plans discussed with the patient, and he is in agreement.  Patient refused me calling any family at this time.  CODE STATUS:  DNR  TOTAL TIME TAKING CARE OF THIS PATIENT: 50 minutes.    Alford Highland M.D on 01/21/2020 at 4:38 PM  Between 7am to 6pm - Pager - [phone]  After 6pm call admission pager [phone]  [facility]  CC: Primary care physician; Charolett Bumpers, PA-C

## 2020-01-21 NOTE — ED Provider Notes (Signed)
[facility] Emergency Department Provider Note    First MD Initiated Contact with Patient 01/21/20 1353     (approximate)  I have reviewed the triage vital signs and the nursing notes.   HISTORY  Chief Complaint Atrial Fibrillation (RVR)    HPI Ross Fisher is a 74 y.o. male below listed past medical history presents to the ER for worsening shortness of breath over the past 2 to 3 days.  Denies any chest pain or pressure.  Does not wear home oxygen.  Found to be hypoxic via EMS.  Denies any sick contacts.  No measured temperatures.   He does continue to smoke.   Past Medical History:  Diagnosis Date  . Back pain   . COPD (chronic obstructive pulmonary disease) (HCC)   . High cholesterol   . Hypercholesteremia   . Hypertension    No family history on file. Past Surgical History:  Procedure Laterality Date  . CATARACT EXTRACTION    . ROTATOR CUFF REPAIR     bilat   There are no problems to display for this patient.     Prior to Admission medications   Medication Sig Start Date End Date Taking? Authorizing Provider  acetaminophen-codeine (TYLENOL #3) 300-30 MG tablet Take 1 tablet by mouth 4 (four) times daily as needed for moderate pain.    Yes [provider]  albuterol (PROVENTIL HFA;VENTOLIN HFA) 108 (90 BASE) MCG/ACT inhaler Inhale 1 puff into the lungs every 6 (six) hours as needed for wheezing or shortness of breath.    Yes [provider]  amLODipine (NORVASC) 10 MG tablet Take 10 mg by mouth daily.   Yes [provider]  budesonide-formoterol (SYMBICORT) 160-4.5 MCG/ACT inhaler Inhale 2 puffs into the lungs 2 (two) times daily.   Yes [provider]  carvedilol (COREG) 12.5 MG tablet Take 12.5 mg by mouth 2 (two) times daily with a meal.   Yes [provider]  diclofenac Sodium (VOLTAREN) 1 % GEL Apply 2 g topically 4 (four) times daily as needed (pain).   Yes [provider]   gabapentin (NEURONTIN) 400 MG capsule Take 800 mg by mouth every 8 (eight) hours.    Yes [provider]  methocarbamol (ROBAXIN) 750 MG tablet Take 750 mg by mouth every 8 (eight) hours as needed for muscle spasms.    Yes [provider]  mirtazapine (REMERON) 30 MG tablet Take 30 mg by mouth at bedtime.   Yes [provider]  simvastatin (ZOCOR) 40 MG tablet Take 20 mg by mouth daily.    Yes [provider]  traZODone (DESYREL) 100 MG tablet Take 200 mg by mouth at bedtime.    Yes [provider]  triprolidine-pseudoephedrine (APRODINE) 2.5-60 MG TABS tablet Take 1 tablet by mouth every 6 (six) hours as needed for allergies.   Yes [provider]    Allergies Patient has no known allergies.    Social History Social History   Tobacco Use  . Smoking status: Current Every Day Smoker    Types: Cigarettes  . Smokeless tobacco: Never Used  Substance Use Topics  . Alcohol use: No  . Drug use: No    Review of Systems Patient denies headaches, rhinorrhea, blurry vision, numbness, shortness of breath, chest pain, edema, cough, abdominal pain, nausea, vomiting, diarrhea, dysuria, fevers, rashes or hallucinations unless otherwise stated above in HPI. ____________________________________________   PHYSICAL EXAM:  VITAL SIGNS: Vitals:   01/21/20 1428 01/21/20 1445  BP:  (!) 140/108  Pulse: (!) 110 95  Resp: (!) 25 (!) 33  Temp:    SpO2: 100% 99%    Constitutional: Alert, critically ill-appearing diaphoretic and gray Eyes: Conjunctivae are normal.  Head: Atraumatic. Nose: No congestion/rhinnorhea. Mouth/Throat: Mucous membranes are moist.   Neck: No stridor. Painless ROM.  Cardiovascular:   Tachycardic irregular rhythm. Grossly normal heart sounds.  Good peripheral circulation. Respiratory: tachypnea with scattered wheeze, diminished posterior bs Gastrointestinal: Soft and nontender. No distention. No abdominal bruits. No  CVA tenderness. Genitourinary: deferred Musculoskeletal: No lower extremity tenderness nor edema.  No joint effusions. Neurologic:  Normal speech and language. No gross focal neurologic deficits are appreciated. No facial droop Skin:  Skin is cool, diaphoretic and intact. No rash noted. Psychiatric: Mood and affect are anxious . Speech and behavior are normal.  ____________________________________________   LABS (all labs ordered are listed, but only abnormal results are displayed)  Results for orders placed or performed during the hospital encounter of 01/21/20 (from the past 24 hour(s))  CBC with Differential/Platelet     Status: Abnormal   Collection Time: 01/21/20  1:58 PM  Result Value Ref Range   WBC 9.9 4.0 - 10.5 K/uL   RBC 4.25 4.22 - 5.81 MIL/uL   Hemoglobin 13.9 13.0 - 17.0 g/dL   HCT 27.2 39 - 52 %   MCV 94.4 80.0 - 100.0 fL   MCH 32.7 26.0 - 34.0 pg   MCHC 34.7 30.0 - 36.0 g/dL   RDW 53.6 (H) 64.4 - 03.4 %   Platelets 269 150 - 400 K/uL   nRBC 0.0 0.0 - 0.2 %   Neutrophils Relative % 82 %   Neutro Abs 8.1 (H) 1.7 - 7.7 K/uL   Lymphocytes Relative 9 %   Lymphs Abs 0.9 0.7 - 4.0 K/uL   Monocytes Relative 7 %   Monocytes Absolute 0.7 0 - 1 K/uL   Eosinophils Relative 0 %   Eosinophils Absolute 0.0 0 - 0 K/uL   Basophils Relative 1 %   Basophils Absolute 0.1 0 - 0 K/uL   Immature Granulocytes 1 %   Abs Immature Granulocytes 0.05 0.00 - 0.07 K/uL  Comprehensive metabolic panel     Status: Abnormal   Collection Time: 01/21/20  1:58 PM  Result Value Ref Range   Sodium 139 135 - 145 mmol/L   Potassium 4.6 3.5 - 5.1 mmol/L   Chloride 105 98 - 111 mmol/L   CO2 21 (L) 22 - 32 mmol/L   Glucose, Bld 125 (H) 70 - 99 mg/dL   BUN 15 8 - 23 mg/dL   Creatinine, Ser 7.42 0.61 - 1.24 mg/dL   Calcium 8.7 (L) 8.9 - 10.3 mg/dL   Total Protein 7.3 6.5 - 8.1 g/dL   Albumin 4.1 3.5 - 5.0 g/dL   AST 59 (H) 15 - 41 U/L   ALT 66 (H) 0 - 44 U/L   Alkaline Phosphatase 75 38 - 126  U/L   Total Bilirubin 1.5 (H) 0.3 - 1.2 mg/dL   GFR calc non Af Amer >60 >60 mL/min   GFR calc Af Amer >60 >60 mL/min   Anion gap 13 5 - 15  Brain natriuretic peptide     Status: Abnormal   Collection Time: 01/21/20  1:58 PM  Result Value Ref Range   B Natriuretic Peptide 1,455.0 (H) 0.0 - 100.0 pg/mL  Fibrin derivatives D-Dimer ([facility] only)     Status: Abnormal   Collection Time: 01/21/20  1:58 PM  Result Value Ref Range   Fibrin derivatives D-dimer ([facility]) 6,875.27 (H) 0.00 - 499.00 ng/mL (FEU)  SARS Coronavirus 2 by RT PCR (hospital order, performed in [facility] hospital lab) Nasopharyngeal Nasopharyngeal Swab     Status: None   Collection Time: 01/21/20  1:58 PM   Specimen: Nasopharyngeal Swab  Result Value Ref Range   SARS Coronavirus 2 NEGATIVE NEGATIVE   ____________________________________________  EKG My review and personal interpretation at Time:  13:52  Indication: sob  Rate: 150  Rhythm: afib with rvr Axis: normal Other: afib w/ rvr, st abnm likely rate dependent ____________________________________________  RADIOLOGY  I personally reviewed all radiographic images ordered to evaluate for the above acute complaints and reviewed radiology reports and findings.  These findings were personally discussed with the patient.  Please see medical record for radiology report.  ____________________________________________   PROCEDURES  Procedure(s) performed:  .Critical Care Performed by: Willy Eddy, MD Authorized by: Willy Eddy, MD   Critical care provider statement:    Critical care time (minutes):  35   Critical care time was exclusive of:  Separately billable procedures and treating other patients   Critical care was necessary to treat or prevent imminent or life-threatening deterioration of the following conditions:  Respiratory failure, cardiac failure and trauma   Critical care was time spent personally by me on the following activities:  Development  of treatment plan with patient or surrogate, discussions with consultants, evaluation of patient's response to treatment, examination of patient, obtaining history from patient or surrogate, ordering and performing treatments and interventions, ordering and review of laboratory studies, ordering and review of radiographic studies, pulse oximetry, re-evaluation of patient's condition and review of old charts      Critical Care performed: yes ____________________________________________   INITIAL IMPRESSION / ASSESSMENT AND PLAN / ED COURSE  Pertinent labs & imaging results that were available during my care of the patient were reviewed by me and considered in my medical decision making (see chart for details).   DDX: Asthma, copd, CHF, pna, ptx, malignancy, Pe, anemia  MACDONALD RIGOR is a 74 y.o. who presents to the ED with symptoms as described above. Patient critically ill-appearing. Found to be in A. fib with RVR therefore was given IV Cardizem bolus for rate control as he was diaphoretic and ill-appearing but was hypertensive. Placed on BiPAP for respiratory support and increased work of breathing. Given Solu-Medrol as well as neb.  The patient will be placed on continuous pulse oximetry and telemetry for monitoring.  Laboratory evaluation will be sent to evaluate for the above complaints.     Clinical Course as of Jan 21 1603  Tue Jan 21, 2020  1426 Patient reassessed.  Appears more comfortable on BiPAP.  Heart rate improved on Cardizem infusion.  Chest x-ray showing findings concerning for bilateral pulmonary edema.  We will continue with rate control.  We will give some nitrates as his diastolic is elevated.   [PR]  1602 CTA does not show any evidence of PE. The findings are concerning for CHF. Will give Lasix. Now that his Covid is negative we will give him additional nebulizers. Patient will require hospitalization. Have paged hospitalist.   [PR]    Clinical Course User  Index [PR] Willy Eddy, MD    The patient was evaluated in Emergency Department today for the symptoms described in the history of present illness. He/she was evaluated in the context of the global COVID-19 pandemic, which necessitated consideration that the  patient might be at risk for infection with the SARS-CoV-2 virus that causes COVID-19. Institutional protocols and algorithms that pertain to the evaluation of patients at risk for COVID-19 are in a state of rapid change based on information released by regulatory bodies including the CDC and federal and state organizations. These policies and algorithms were followed during the patient's care in the ED.  As part of my medical decision making, I reviewed the following data within the electronic MEDICAL RECORD NUMBER Nursing notes reviewed and incorporated, Labs reviewed, notes from prior ED visits and [place] Controlled Substance Database   ____________________________________________   FINAL CLINICAL IMPRESSION(S) / ED DIAGNOSES  Final diagnoses:  Atrial fibrillation with RVR (HCC)  Acute pulmonary edema (HCC)      NEW MEDICATIONS STARTED DURING THIS VISIT:  New Prescriptions   No medications on file     Note:  This document was prepared using Dragon voice recognition software and may include unintentional dictation errors.    Willy Eddy, MD 01/21/20 [phone]

## 2020-01-21 NOTE — ED Notes (Addendum)
Respiratory asked by this RN to add component for breathing tx. RT stating,"We do not put that on if they are suspected COVID." MD made aware.

## 2020-01-21 NOTE — Progress Notes (Signed)
ANTICOAGULATION CONSULT NOTE - Initial Consult  Pharmacy Consult for Lovenox treatment dose  Indication: atrial fibrillation  No Known Allergies  Patient Measurements: Height: 5\' 8"  (172.7 cm) Weight: 60 kg (132 lb 4.4 oz) IBW/kg (Calculated) : 68.4 Heparin Dosing Weight:    Vital Signs: Temp: 97.4 F (36.3 C) (09/21 1353) Temp Source: Oral (09/21 1353) BP: 149/84 (09/21 1630) Pulse Rate: 119 (09/21 1630)  Labs: Recent Labs    01/21/20 1358  HGB 13.9  HCT 40.1  PLT 269  CREATININE 1.15    Estimated Creatinine Clearance: 49.3 mL/min (by C-G formula based on SCr of 1.15 mg/dL).   Medical History: Past Medical History:  Diagnosis Date  . Back pain   . COPD (chronic obstructive pulmonary disease) (HCC)   . High cholesterol   . Hypercholesteremia   . Hypertension     Medications:  Scheduled:  . carvedilol  12.5 mg Oral BID WC  . enoxaparin (LOVENOX) injection  1 mg/kg Subcutaneous Q12H  . [START ON 01/22/2020] furosemide  40 mg Intravenous BID  . gabapentin  800 mg Oral Q8H  . losartan  25 mg Oral Daily  . [START ON 01/22/2020] methylPREDNISolone (SOLU-MEDROL) injection  40 mg Intravenous Q12H  . mirtazapine  30 mg Oral QHS  . mometasone-formoterol  2 puff Inhalation BID  . nicotine  21 mg Transdermal Daily  . sodium chloride flush  3 mL Intravenous Q12H  . traZODone  200 mg Oral QHS   Infusions:  . sodium chloride    . azithromycin 500 mg (01/21/20 1748)  . cefTRIAXone (ROCEPHIN)  IV 2 g (01/21/20 1751)  . diltiazem (CARDIZEM) infusion 12.5 mg/hr (01/21/20 1734)    Assessment: 74 yo M with Afib to start Lovenox treatment dose Hgb 13.9  Plt 269  Scr 1.15  Goal of Therapy:  Monitor platelets by anticoagulation protocol: Yes   Plan:  Lovenox 1 gm/kg (60 mg) q12h. F/u CBC/reanl fxn per protocol  Britani Beattie A 01/21/2020,5:53 PM

## 2020-01-21 NOTE — Progress Notes (Signed)
PHARMACY -  BRIEF ANTIBIOTIC NOTE   Pharmacy has received consult(s) for Cefepime from an ED provider.  The patient's profile has been reviewed for ht/wt/allergies/indication/available labs.    One time order(s) placed by MD for Cefepime 2 gm  Further antibiotics/pharmacy consults should be ordered by admitting physician if indicated.                       Thank you, Fidela Cieslak A 01/21/2020  4:18 PM

## 2020-01-21 NOTE — Consult Note (Signed)
CARDIOLOGY CONSULT NOTE               Patient ID: Ross Fisher MRN: [id number] DOB/AGE: 1946/04/17 74 y.o.  Admit date: 01/21/2020 Referring Physician Dr Suzan Garibaldi hospitalist Primary Physician Unknown Primary Cardiologist [facility] Reason for Consultation CHF SOB AFIB  HPI: 65 y/o WM who c/o of recent sob dyspnea history of smoking symptoms worsen over the last 3 days he was found to be hypoxic by EMS no fever chills or sweats denies any chest pain but with worsening symptoms of dyspnea shortness of breath appears to be COPD exacerbation he was brought to the emergency room and placed on supplemental oxygen and BiPAP patient also was found to have rapid heart rate irregular thought to be atrial fibrillation with no previous history so he was treated aggressively and cardiology consultation was done recommended  Review of systems complete and found to be negative unless listed above     Past Medical History:  Diagnosis Date  . Back pain   . COPD (chronic obstructive pulmonary disease) (HCC)   . High cholesterol   . Hypercholesteremia   . Hypertension     Past Surgical History:  Procedure Laterality Date  . CATARACT EXTRACTION    . ROTATOR CUFF REPAIR     bilat    (Not in a hospital admission)  Social History   Socioeconomic History  . Marital status: Single    Spouse name: Not on file  . Number of children: Not on file  . Years of education: Not on file  . Highest education level: Not on file  Occupational History  . Not on file  Tobacco Use  . Smoking status: Current Every Day Smoker    Types: Cigarettes  . Smokeless tobacco: Never Used  Substance and Sexual Activity  . Alcohol use: No  . Drug use: No  . Sexual activity: Not on file  Other Topics Concern  . Not on file  Social History Narrative   ** Merged History Encounter **       Social Determinants of Health   Financial Resource Strain:   . Difficulty of Paying Living Expenses: Not on file   Food Insecurity:   . Worried About Programme researcher, broadcasting/film/video in the Last Year: Not on file  . Ran Out of Food in the Last Year: Not on file  Transportation Needs:   . Lack of Transportation (Medical): Not on file  . Lack of Transportation (Non-Medical): Not on file  Physical Activity:   . Days of Exercise per Week: Not on file  . Minutes of Exercise per Session: Not on file  Stress:   . Feeling of Stress : Not on file  Social Connections:   . Frequency of Communication with Friends and Family: Not on file  . Frequency of Social Gatherings with Friends and Family: Not on file  . Attends Religious Services: Not on file  . Active Member of Clubs or Organizations: Not on file  . Attends Banker Meetings: Not on file  . Marital Status: Not on file  Intimate Partner Violence:   . Fear of Current or Ex-Partner: Not on file  . Emotionally Abused: Not on file  . Physically Abused: Not on file  . Sexually Abused: Not on file    Family History  Problem Relation Age of Onset  . CAD Mother   . Alzheimer's disease Father       Review of systems complete and found to be negative unless  listed above      PHYSICAL EXAM  General: Well developed, well nourished, in no acute distress HEENT:  Normocephalic and atramatic Neck:  No JVD.  Lungs: Diminished  bilaterally to auscultation and percussion. Heart: Tachycardia irregular. Normal S1 and S2 without gallops or murmurs.  Abdomen: Bowel sounds are positive, abdomen soft and non-tender  Msk:  Back normal, normal gait. Normal strength and tone for age. Extremities: No clubbing, cyanosis or edema.   Neuro: Alert and oriented X 3. Psych:  Good affect, responds appropriately  Labs:   Lab Results  Component Value Date   WBC 9.9 01/21/2020   HGB 13.9 01/21/2020   HCT 40.1 01/21/2020   MCV 94.4 01/21/2020   PLT 269 01/21/2020    Recent Labs  Lab 01/21/20 1358  NA 139  K 4.6  CL 105  CO2 21*  BUN 15  CREATININE 1.15   CALCIUM 8.7*  PROT 7.3  BILITOT 1.5*  ALKPHOS 75  ALT 66*  AST 59*  GLUCOSE 125*   Lab Results  Component Value Date   CKTOTAL 65 05/02/2011   CKMB 2.4 05/02/2011   TROPONINI <0.03 06/20/2016   No results found for: CHOL No results found for: HDL No results found for: LDLCALC No results found for: TRIG No results found for: CHOLHDL No results found for: LDLDIRECT    Radiology: CT Angio Chest PE W and/or Wo Contrast  Result Date: 01/21/2020 CLINICAL DATA:  Shortness of breath.  AFib with RVR. EXAM: CT ANGIOGRAPHY CHEST WITH CONTRAST TECHNIQUE: Multidetector CT imaging of the chest was performed using the standard protocol during bolus administration of intravenous contrast. Multiplanar CT image reconstructions and MIPs were obtained to evaluate the vascular anatomy. CONTRAST:  66mL OMNIPAQUE IOHEXOL 350 MG/ML SOLN COMPARISON:  June 20, 2016. FINDINGS: Cardiovascular: Satisfactory opacification of the pulmonary arteries to the segmental level. No evidence of pulmonary embolism. Limited evaluation secondary to respiratory motion. Mild cardiomegaly. No pericardial effusion. Three-vessel coronary artery atherosclerotic calcifications. Aortic valve calcifications. Moderate to severe atherosclerotic calcifications throughout the aorta. Mediastinum/Nodes: No suspicious mediastinal lymph nodes. No suspicious axillary lymph nodes. Thyroid is unremarkable. Lungs/Pleura: Centrilobular emphysema. There is diffuse interlobular septal thickening. There is bronchial wall thickening. Small bilateral pleural effusions. There is more confluent bibasilar consolidative and ground-glass opacities in the dependent lungs. Unchanged RIGHT apical greater than LEFT apical scarring. Scattered debris within the trachea and bronchi. Evaluation for fine parenchymal detail is limited secondary to extensive respiratory motion. Upper Abdomen: No acute abnormality. Musculoskeletal: No chest wall abnormality. No acute or  significant osseous findings. Unchanged sclerotic lesion of the LEFT posterior seventh rib, likely a bone island. Review of the MIP images confirms the above findings. IMPRESSION: 1. No evidence of acute pulmonary embolism. 2. Small bilateral pleural effusions with diffuse interlobular septal thickening and bronchial wall thickening, consistent with pulmonary edema. More confluent bibasilar consolidative and ground-glass opacities may reflect aspiration or infection. Aortic Atherosclerosis (ICD10-I70.0) and Emphysema (ICD10-J43.9). Electronically Signed   By: Meda Klinefelter MD   On: 01/21/2020 15:56   DG Chest Portable 1 View  Result Date: 01/21/2020 CLINICAL DATA:  Shortness of breath. EXAM: PORTABLE CHEST 1 VIEW COMPARISON:  June 20, 2016. FINDINGS: Stable cardiomediastinal silhouette. No pneumothorax is noted. Diffuse interstitial densities are noted throughout both lungs concerning for pulmonary edema. Minimal pleural effusions may be present. Bony thorax is unremarkable. IMPRESSION: Findings consistent with bilateral pulmonary edema. Electronically Signed   By: Lupita Raider M.D.   On: 01/21/2020 14:16  EKG: AFIB RVR 130  ASSESSMENT AND PLAN:  AFIB RVR SOB COPD Hypoxemia Hyperlipidemia HTN Smoking Resp Failure . Plan Continue supplimental O2 Inhalers as needed Steroid therapy Rate control for AFIB Anticougution short term with Heparin or Lovenox Advise to quit smoking Diuretic therapy for heart failure Statin therapy  Consider pulmonary input for resp failure Echo for CHF and SOB   Signed: Alwyn Pea MD 01/21/2020, 4:40 PM

## 2020-01-22 ENCOUNTER — Other Ambulatory Visit: Payer: Self-pay

## 2020-01-22 ENCOUNTER — Inpatient Hospital Stay
Admit: 2020-01-22 | Discharge: 2020-01-22 | Disposition: A | Discharge status: Home / Self Care | Payer: Medicare Other | Attending: Internal Medicine | Admitting: Internal Medicine

## 2020-01-22 DIAGNOSIS — E8770 Fluid overload, unspecified: Secondary | ICD-10-CM

## 2020-01-22 DIAGNOSIS — R945 Abnormal results of liver function studies: Secondary | ICD-10-CM

## 2020-01-22 LAB — ECHOCARDIOGRAM COMPLETE
AR max vel: 2.25 cm2
AV Area VTI: 2.23 cm2
AV Area mean vel: 2.11 cm2
AV Mean grad: 2 mmHg
AV Peak grad: 3.5 mmHg
Ao pk vel: 0.94 m/s
Area-P 1/2: 5.23 cm2
Height: 68 in
S' Lateral: 3.06 cm
Weight: 2116.42 oz

## 2020-01-22 LAB — BASIC METABOLIC PANEL
Anion gap: 13 (ref 5–15)
BUN: 21 mg/dL (ref 8–23)
CO2: 21 mmol/L — ABNORMAL LOW (ref 22–32)
Calcium: 8.2 mg/dL — ABNORMAL LOW (ref 8.9–10.3)
Chloride: 103 mmol/L (ref 98–111)
Creatinine, Ser: 1.06 mg/dL (ref 0.61–1.24)
GFR calc Af Amer: >60 mL/min (ref >60)
GFR calc non Af Amer: >60 mL/min (ref >60)
Glucose, Bld: 131 mg/dL — ABNORMAL HIGH (ref 70–99)
Potassium: 4.1 mmol/L (ref 3.5–5.1)
Sodium: 137 mmol/L (ref 135–145)

## 2020-01-22 LAB — CBC
HCT: 35.8 % — ABNORMAL LOW (ref 39.0–52.0)
Hemoglobin: 12.9 g/dL — ABNORMAL LOW (ref 13.0–17.0)
MCH: 33.2 pg (ref 26.0–34.0)
MCHC: 36 g/dL (ref 30.0–36.0)
MCV: 92 fL (ref 80.0–100.0)
Platelets: 230 10*3/uL (ref 150–400)
RBC: 3.89 MIL/uL — ABNORMAL LOW (ref 4.22–5.81)
RDW: 15.6 % — ABNORMAL HIGH (ref 11.5–15.5)
WBC: 8.1 10*3/uL (ref 4.0–10.5)
nRBC: 0 % (ref 0.0–0.2)

## 2020-01-22 LAB — PROCALCITONIN: Procalcitonin: 0.3 ng/mL

## 2020-01-22 LAB — TSH: TSH: 0.444 u[IU]/mL (ref 0.350–4.500)

## 2020-01-22 MED ORDER — DILTIAZEM HCL 30 MG PO TABS
60.0000 mg | ORAL_TABLET | Freq: Three times a day (TID) | ORAL | Status: DC
  Administered 2020-01-22 – 2020-01-23 (×3): 60 mg via ORAL
  Filled 2020-01-22 (×2): qty 2
  Filled 2020-01-22: qty 1

## 2020-01-22 MED ORDER — GUAIFENESIN ER 600 MG PO TB12
1200.0000 mg | ORAL_TABLET | Freq: Two times a day (BID) | ORAL | Status: DC
  Administered 2020-01-22 – 2020-01-23 (×2): 1200 mg via ORAL
  Filled 2020-01-22 (×2): qty 2

## 2020-01-22 NOTE — Progress Notes (Signed)
[facility] Cardiology    SUBJECTIVE: Still resting comfortably not answering very many questions lethargic sleepy denies any pain   Vitals:   01/22/20 1200 01/22/20 1300 01/22/20 1400 01/22/20 1500  BP: 105/73 103/77 (!) 129/58 120/80  Pulse: 64 60 (!) 106 71  Resp: 14  (!) 30 20  Temp:      TempSrc:      SpO2: 97% 95% 96% 93%  Weight:      Height:         Intake/Output Summary (Last 24 hours) at 01/22/2020 1619 Last data filed at 01/22/2020 1515 Gross per 24 hour  Intake 244.62 ml  Output 2000 ml  Net -1755.38 ml      PHYSICAL EXAM  General: Well developed, well nourished, in no acute distress HEENT:  Normocephalic and atramatic Neck:  No JVD.  Lungs: Diminished bilaterally to auscultation and percussion. Heart: Irregular. Normal S1 and S2 without gallops or murmurs.  Abdomen: Bowel sounds are positive, abdomen soft and non-tender  Msk:  Back normal, normal gait. Normal strength and tone for age. Extremities: No clubbing, cyanosis or edema.   Neuro: Alert and oriented X 3. Psych:  Good affect, responds appropriately   LABS: Basic Metabolic Panel: Recent Labs    01/21/20 1358 01/22/20 0435  NA 139 137  K 4.6 4.1  CL 105 103  CO2 21* 21*  GLUCOSE 125* 131*  BUN 15 21  CREATININE 1.15 1.06  CALCIUM 8.7* 8.2*   Liver Function Tests: Recent Labs    01/21/20 1358  AST 59*  ALT 66*  ALKPHOS 75  BILITOT 1.5*  PROT 7.3  ALBUMIN 4.1   No results for input(s): LIPASE, AMYLASE in the last 72 hours. CBC: Recent Labs    01/21/20 1358 01/22/20 0435  WBC 9.9 8.1  NEUTROABS 8.1*  --   HGB 13.9 12.9*  HCT 40.1 35.8*  MCV 94.4 92.0  PLT 269 230   Cardiac Enzymes: No results for input(s): CKTOTAL, CKMB, CKMBINDEX, TROPONINI in the last 72 hours. BNP: Invalid input(s): POCBNP D-Dimer: No results for input(s): DDIMER in the last 72 hours. Hemoglobin A1C: No results for input(s): HGBA1C in the last 72 hours. Fasting Lipid Panel: No results for input(s):  CHOL, HDL, LDLCALC, TRIG, CHOLHDL, LDLDIRECT in the last 72 hours. Thyroid Function Tests: Recent Labs    01/22/20 0435  TSH 0.444   Anemia Panel: No results for input(s): VITAMINB12, FOLATE, FERRITIN, TIBC, IRON, RETICCTPCT in the last 72 hours.  CT Angio Chest PE W and/or Wo Contrast  Result Date: 01/21/2020 CLINICAL DATA:  Shortness of breath.  AFib with RVR. EXAM: CT ANGIOGRAPHY CHEST WITH CONTRAST TECHNIQUE: Multidetector CT imaging of the chest was performed using the standard protocol during bolus administration of intravenous contrast. Multiplanar CT image reconstructions and MIPs were obtained to evaluate the vascular anatomy. CONTRAST:  69mL OMNIPAQUE IOHEXOL 350 MG/ML SOLN COMPARISON:  June 20, 2016. FINDINGS: Cardiovascular: Satisfactory opacification of the pulmonary arteries to the segmental level. No evidence of pulmonary embolism. Limited evaluation secondary to respiratory motion. Mild cardiomegaly. No pericardial effusion. Three-vessel coronary artery atherosclerotic calcifications. Aortic valve calcifications. Moderate to severe atherosclerotic calcifications throughout the aorta. Mediastinum/Nodes: No suspicious mediastinal lymph nodes. No suspicious axillary lymph nodes. Thyroid is unremarkable. Lungs/Pleura: Centrilobular emphysema. There is diffuse interlobular septal thickening. There is bronchial wall thickening. Small bilateral pleural effusions. There is more confluent bibasilar consolidative and ground-glass opacities in the dependent lungs. Unchanged RIGHT apical greater than LEFT apical scarring. Scattered debris within the  trachea and bronchi. Evaluation for fine parenchymal detail is limited secondary to extensive respiratory motion. Upper Abdomen: No acute abnormality. Musculoskeletal: No chest wall abnormality. No acute or significant osseous findings. Unchanged sclerotic lesion of the LEFT posterior seventh rib, likely a bone island. Review of the MIP images  confirms the above findings. IMPRESSION: 1. No evidence of acute pulmonary embolism. 2. Small bilateral pleural effusions with diffuse interlobular septal thickening and bronchial wall thickening, consistent with pulmonary edema. More confluent bibasilar consolidative and ground-glass opacities may reflect aspiration or infection. Aortic Atherosclerosis (ICD10-I70.0) and Emphysema (ICD10-J43.9). Electronically Signed   By: Meda Klinefelter MD   On: 01/21/2020 15:56   DG Chest Portable 1 View  Result Date: 01/21/2020 CLINICAL DATA:  Shortness of breath. EXAM: PORTABLE CHEST 1 VIEW COMPARISON:  June 20, 2016. FINDINGS: Stable cardiomediastinal silhouette. No pneumothorax is noted. Diffuse interstitial densities are noted throughout both lungs concerning for pulmonary edema. Minimal pleural effusions may be present. Bony thorax is unremarkable. IMPRESSION: Findings consistent with bilateral pulmonary edema. Electronically Signed   By: Lupita Raider M.D.   On: 01/21/2020 14:16     Echo EF around 50% preserved  TELEMETRY: Atrial fibrillation rapid ventricular response:  ASSESSMENT AND PLAN:  Active Problems:   Acute hypoxemic respiratory failure (HCC)   Atrial fibrillation with RVR (HCC) COPD exacerbation Hypoxemia Hypertension Hyperlipidemia Altered mental status Congestive heart failure  Plan Agree with telemetry for atrial fibrillation monitoring rate control Continue supplemental oxygen and inhalers for respiratory failure Maintain broad-spectrum antibiotic therapy for bronchitis possible pneumonia Steroid therapy for inflammation related to COPD Rate control beta-blocker calcium blocker as necessary Short-term anticoagulation will consider long-term therapy once patient discharged Diuretic therapy for mild heart failure Continue Coreg beta-blocker consider switching to [facility] for heart failure management   Alwyn Pea, MD 01/22/2020 4:19 PM

## 2020-01-22 NOTE — ED Notes (Signed)
Pt given urinal. Will check on pt after he is done urinating.

## 2020-01-22 NOTE — ED Notes (Signed)
Took pt the phone so he could speak with his mother

## 2020-01-22 NOTE — TOC Initial Note (Addendum)
Transition of Care [facility]) - Initial/Assessment Note    Patient Details  Name: Ross Fisher MRN: [id number] Date of Birth: 1946-02-20  [facility] CM/SW Contact:    [place] Cellar, RN Phone Number: 01/22/2020, 11:46 AM  Clinical Narrative:                 Ross Fisher to Ross Fisher, patients mother, who reports patient lives with her. Patient is independent with all ADL's and continues to drive. Patient is heavy smoker per Ross Fisher but she does not know how much he smokes daily. Patient is retired. Ross Fisher concerned about patient suffering cat bite on his hand 4 days ago and wants to be sure staff is checking on the bite.  Expected Discharge Plan: Home/Self Care Barriers to Discharge: Continued Medical Work up   Patient Goals and CMS Choice Patient states their goals for this hospitalization and ongoing recovery are:: Return home      Expected Discharge Plan and Services Expected Discharge Plan: Home/Self Care       Living arrangements for the past 2 months: Single Family Home                                      Prior Living Arrangements/Services Living arrangements for the past 2 months: Single Family Home Lives with:: Parents Patient language and need for interpreter reviewed:: Yes Do you feel safe going back to the place where you live?: Yes      Need for Family Participation in Patient Care: Yes (Comment) Care giver support system in place?: Yes (comment)   Criminal Activity/Legal Involvement Pertinent to Current Situation/Hospitalization: No - Comment as needed  Activities of Daily Living      Permission Sought/Granted Permission sought to share information with : Family Supports Permission granted to share information with : Yes, Verbal Permission Granted  Share Information with NAME: Mother-Ross Fisher           Emotional Assessment Appearance:: Appears stated age Attitude/Demeanor/Rapport: Gracious Affect (typically observed): Accepting Orientation:  : Oriented to Self, Oriented to Place, Oriented to  Time, Oriented to Situation Alcohol / Substance Use: Tobacco Use Psych Involvement: No (comment)  Admission diagnosis:  Acute hypoxemic respiratory failure (HCC) [J96.01] Patient Active Problem List   Diagnosis Date Noted  . Acute hypoxemic respiratory failure (HCC) 01/21/2020  . Acute congestive heart failure (HCC)   . Atrial fibrillation with RVR (HCC)   . Elevated d-dimer   . COPD with acute exacerbation (HCC)   . Essential hypertension   . Hyperlipidemia   . Tobacco abuse    PCP:  Ross Bumpers, PA-C Pharmacy:   [facility] [place] - [address] [place] [address] Phone: [phone] Fax: [phone]     Social Determinants of Health (SDOH) Interventions    Readmission Risk Interventions No flowsheet data found.

## 2020-01-22 NOTE — Progress Notes (Signed)
*  PRELIMINARY RESULTS* Echocardiogram 2D Echocardiogram has been performed.  Ross Fisher 01/22/2020, 11:09 AM

## 2020-01-22 NOTE — ED Notes (Signed)
Pt arousable by minor stimulation at this time, patient comfortable in bed, warm blanket given. Morning labs obtained. Will continue to monitor.

## 2020-01-22 NOTE — ED Notes (Signed)
Pt on bipap sleeping. NAD

## 2020-01-22 NOTE — ED Notes (Signed)
Gave pt breakfast tray with juice. Patient sat up and is eating very well.

## 2020-01-22 NOTE — Progress Notes (Signed)
PROGRESS NOTE    Ross Fisher  FYB:[id number] DOB: Feb 29, 1946 DOA: 01/21/2020 PCP: Charolett Bumpers, PA-C  Brief Narrative:  HPI per Dr. Alford Highland on 01/21/20 Ross Fisher  is a 74 y.o. male coming in with a few weeks of shortness of breath.  Patient states he cannot breathe.  Slight cough.  Slight nausea but no vomiting.  No chest pain.  No palpitations.  In the ER he was found to be in rapid atrial fibrillation and congestive heart failure.  His D-dimer was elevated so he did a CAT scan of the chest which was negative for pulmonary embolism.  Patient was placed on BiPAP secondary to respiratory distress.  Patient feeling slightly better after placed on BiPAP.  Hospitalist services were contacted for further evaluation  **Interim History He was weaned off of his BiPAP to 2 L and his A. fib with RVR is improved and his Cardizem drip was transitioned to p.o. diltiazem.  We will repeat his LFTs in the morning and continue treatment for COPD as well as his A. fib with RVR and have cardiology weigh in about his volume overload.  Will be continuing IV diuretics at this time.   Assessment & Plan:   Active Problems:   Acute hypoxemic respiratory failure (HCC)   Atrial fibrillation with RVR (HCC)  Acute Congestive heart failure, likely diastolic.  -No prior echocardiogram to determine ejection fraction.  -Patient's BNP was 1455 -Patient ordered 60 mg of Lasix in the emergency room.   -Continue Lasix 40 mg IV twice daily.   -Patient already on Coreg and Cozaar was added.   -Echocardiogram and cardiology consultation ordered. -Echocardiogram did not mention any evidence of heart failure and cardiology has been consulted and recommending continuing diuretic therapy -Strict I's and O's and daily weights -Repeat chest x-ray in a.m.  Acute hypoxic respiratory failure with hypoxia requiring noninvasive positive pressure ventilation with BiPAP -  Patient currently breathing better on BiPAP  40% FiO2.   -Hopefully can taper off BiPAP shortly.  Initial Covid test negative. -Patient being weaned off of BiPAP to 2 L and is improved -SpO2: 94 % O2 Flow Rate (L/min): 2 L/min FiO2 (%): 40 %  -C/w Dulera and Steroids -Continuous pulse oximetry maintain O2 saturations greater than 90% -Continue supplemental oxygen via nasal cannula and wean O2 as tolerated to room air -We will need an ambulatory home O2 screen prior to discharge  Atrial fibrillation with rapid ventricular response.  -Patient was started on Cardizem drip has been transitioned to p.o. Cardizem 60 mg every 8 hours cardiology -We will add oral Coreg 12.5 mg p.o. twice daily and hopefully can come off Cardizem drip.   -Check a TSH.  -ER physician ordered IV magnesium.   -Placed on high-dose Lovenox. -Cardiology consulted for further evaluation recommendations  Elevated Troponin -Troponin Level was 254 -> 270 -In the setting of Demand Ischemia from A Fib with RVR and Acute Respiratory Failure -Continue to Monitor and denies Chest Pain -Cardiology following  Abnormal LFTs -AST on admission was 59 ALT is 66 and likely this is in the setting of passive hepatic congestion from his suspected heart failure -Continue to monitor and trend function panel carefully and if necessary will obtain a right upper quadrant ultrasound as well as an acute hepatitis panel -Repeat CMP in the AM  Hyperbilirubinemia -Mild with T Bili of 1.5 -Continue to Monitor and Trend -Repeat CMP in the AM    Elevated D-dimer.   -CT scan of  the chest negative for pulmonary embolism  COPD exacerbation with possible pneumonia.   -PCT was 0.30   -Start Rocephin and Zithromax. -C/w Dulera -C/w IV Solumedrol 40 mg q12h  Essential Hypertension  -on carvedilol 12.5 mg p.o. twice daily as well as diltiazem 60 mg p.o. every 8 hours -Also getting diuresis with IV Lasix 40 mg twice daily -Also is on losartan 25 mg p.o. daily  Hyperlipidemia  -Held  Zocor while on Cardizem drip -Now that he is off the Cardizem drip likely can be resumed  Tobacco Abuse  -Nicotine patch ordered -Smoking Cessation Counseling given   Metabolic Acidosis -Mild -CO2 was 21, Chloride Level was 103, and AG was 13 -Continue to Monitor and Trend -Repeat CBC in the AM  GOC -Patient is a DO NOT RESUSCITATE  DVT prophylaxis: Lovenox 1 mg/kg Code Status: DO NOT RESUSCITATE  Family Communication: No family present at bedside  Disposition Plan: Pending further evaluation and Clearance by Cardiology  Status is: Inpatient  Remains inpatient appropriate because:Ongoing diagnostic testing needed not appropriate for outpatient work up, Unsafe d/c plan, IV treatments appropriate due to intensity of illness or inability to take PO and Inpatient level of care appropriate due to severity of illness   Dispo: The patient is from: Home              Anticipated d/c is to: TBD              Anticipated d/c date is: 2 days              Patient currently is not medically stable to d/c.   Consultants:   Cardiology Dr. Juliann Paresallwood   Procedures:  ECHOCARDIOGRAM IMPRESSIONS    1. Left ventricular ejection fraction, by estimation, is 50 to 55%. The  left ventricle has low normal function. The left ventricle has no regional  wall motion abnormalities. Left ventricular diastolic parameters were  normal.  2. Right ventricular systolic function is normal. The right ventricular  size is normal.  3. The mitral valve is normal in structure. Trivial mitral valve  regurgitation.  4. The aortic valve is normal in structure. Aortic valve regurgitation is  not visualized.   FINDINGS  Left Ventricle: Left ventricular ejection fraction, by estimation, is 50  to 55%. The left ventricle has low normal function. The left ventricle has  no regional wall motion abnormalities. The left ventricular internal  cavity size was normal in size.  There is no left ventricular  hypertrophy. Left ventricular diastolic  parameters were normal.   Right Ventricle: The right ventricular size is normal. No increase in  right ventricular wall thickness. Right ventricular systolic function is  normal.   Left Atrium: Left atrial size was normal in size.   Right Atrium: Right atrial size was normal in size.   Pericardium: There is no evidence of pericardial effusion.   Mitral Valve: The mitral valve is normal in structure. Trivial mitral  valve regurgitation.   Tricuspid Valve: The tricuspid valve is normal in structure. Tricuspid  valve regurgitation is trivial.   Aortic Valve: The aortic valve is normal in structure. Aortic valve  regurgitation is not visualized. Aortic valve mean gradient measures 2.0  mmHg. Aortic valve peak gradient measures 3.5 mmHg. Aortic valve area, by  VTI measures 2.23 cm.   Pulmonic Valve: The pulmonic valve was grossly normal. Pulmonic valve  regurgitation is not visualized.   Aorta: Aortic root could not be assessed.   IAS/Shunts: No atrial level shunt  detected by color flow Doppler.     LEFT VENTRICLE  PLAX 2D  LVIDd:     4.08 cm  LVIDs:     3.06 cm  LV PW:     1.16 cm  LV IVS:    0.89 cm  LVOT diam:   2.00 cm  LV SV:     34  LV SV Index:  20  LVOT Area:   3.14 cm     RIGHT VENTRICLE  RV Basal diam: 3.60 cm  RV S prime:   17.10 cm/s  TAPSE (M-mode): 3.6 cm   LEFT ATRIUM       Index    RIGHT ATRIUM      Index  LA diam:    3.70 cm 2.16 cm/m RA Area:   23.20 cm  LA Vol (A2C):  70.3 ml 41.01 ml/m RA Volume:  71.90 ml 41.94 ml/m  LA Vol (A4C):  61.7 ml 35.99 ml/m  LA Biplane Vol: 68.5 ml 39.96 ml/m  AORTIC VALVE          PULMONIC VALVE  AV Area (Vmax):  2.25 cm  PV Vmax:    0.68 m/s  AV Area (Vmean):  2.11 cm  PV Peak grad:  1.8 mmHg  AV Area (VTI):   2.23 cm  RVOT Peak grad: 2 mmHg  AV Vmax:      93.95 cm/s  AV Vmean:      60.550 cm/s  AV VTI:      0.152 m  AV Peak Grad:   3.5 mmHg  AV Mean Grad:   2.0 mmHg  LVOT Vmax:     67.20 cm/s  LVOT Vmean:    40.600 cm/s  LVOT VTI:     0.108 m  LVOT/AV VTI ratio: 0.71    AORTA  Ao Root diam: 3.30 cm   MITRAL VALVE        TRICUSPID VALVE  MV Area (PHT): 5.23 cm   TR Peak grad:  31.6 mmHg  MV Decel Time: 145 msec   TR Vmax:    281.00 cm/s  MV E velocity: 100.00 cm/s               SHUNTS               Systemic VTI: 0.11 m               Systemic Diam: 2.00 cm   Antimicrobials:  Anti-infectives (From admission, onward)   Start     Dose/Rate Route Frequency Ordered Stop   01/21/20 1700  cefTRIAXone (ROCEPHIN) 2 g in sodium chloride 0.9 % 100 mL IVPB        2 g 200 mL/hr over 30 Minutes Intravenous Every 24 hours 01/21/20 1654     01/21/20 1630  ceFEPIme (MAXIPIME) 2 g in sodium chloride 0.9 % 100 mL IVPB  Status:  Discontinued        2 g 200 mL/hr over 30 Minutes Intravenous  Once 01/21/20 1615 01/21/20 1654   01/21/20 1630  azithromycin (ZITHROMAX) 500 mg in sodium chloride 0.9 % 250 mL IVPB        500 mg 250 mL/hr over 60 Minutes Intravenous  Once 01/21/20 1615 01/21/20 1906        Subjective: Seen and examined at bedside and he had just been weaned off of the BiPAP to 2 L nasal cannula this morning.  Denies any chest pain but still felt a little short of breath.  No nausea or  vomiting.  Denies any other concerns or complaints this time and thinks his legs are less swollen now.  Objective: Vitals:   01/22/20 1300 01/22/20 1400 01/22/20 1500 01/22/20 1700  BP: 103/77 (!) 129/58 120/80 130/76  Pulse: 60 (!) 106 71 96  Resp:  (!) Temp:      TempSrc:      SpO2: 95% 96% 93% 94%  Weight:      Height:        Intake/Output Summary (Last 24 hours) at 01/22/2020 1743 Last data filed at 01/22/2020 1741 Gross per 24 hour  Intake 344.62 ml  Output  2000 ml  Net -1655.38 ml   Filed Weights   01/21/20 1745  Weight: 60 kg   Examination: Physical Exam:  Constitutional: WN/WD chronically ill-appearing Caucasian male currently in NAD and appears calm but slightly uncomfortable Eyes: Lids and conjunctivae normal, sclerae anicteric  ENMT: External Ears, Nose appear normal. Grossly normal hearing. Neck: Appears normal, supple, no cervical masses, normal ROM, no appreciable thyromegaly; no JVD Respiratory: Diminished to auscultation bilaterally with coarse breath sound some crackles and some wheezing, . Normal respiratory effort and patient is not tachypenic. No accessory muscle use.  Wearing 2 L supplemental oxygen via nasal cannula Cardiovascular: Irregularly irregular but not tachycardic now, no murmurs / rubs / gallops. S1 and S2 auscultated.  Has 1+ lower extremity.  Abdomen: Soft, non-tender, non-distended.  Bowel sounds positive.  GU: Deferred. Musculoskeletal: No clubbing / cyanosis of digits/nails. No joint deformity upper and lower extremities.  Skin: No rashes, lesions, ulcers on limited skin evaluation. No induration; Warm and dry.  Neurologic: CN 2-12 grossly intact with no focal deficits. Romberg sign and cerebellar reflexes not assessed.  Psychiatric: Normal judgment and insight. Alert and oriented x 3. Normal mood and appropriate affect.   Data Reviewed: I have personally reviewed following labs and imaging studies  CBC: Recent Labs  Lab 01/21/20 1358 01/22/20 0435  WBC 9.9 8.1  NEUTROABS 8.1*  --   HGB 13.9 12.9*  HCT 40.1 35.8*  MCV 94.4 92.0  PLT 269 230   Basic Metabolic Panel: Recent Labs  Lab 01/21/20 1358 01/22/20 0435  NA 139 137  K 4.6 4.1  CL 105 103  CO2 21* 21*  GLUCOSE 125* 131*  BUN 15 21  CREATININE 1.15 1.06  CALCIUM 8.7* 8.2*   GFR: Estimated Creatinine Clearance: 53.5 mL/min (by C-G formula based on SCr of 1.06 mg/dL). Liver Function Tests: Recent Labs  Lab 01/21/20 1358  AST 59*   ALT 66*  ALKPHOS 75  BILITOT 1.5*  PROT 7.3  ALBUMIN 4.1   No results for input(s): LIPASE, AMYLASE in the last 168 hours. No results for input(s): AMMONIA in the last 168 hours. Coagulation Profile: No results for input(s): INR, PROTIME in the last 168 hours. Cardiac Enzymes: No results for input(s): CKTOTAL, CKMB, CKMBINDEX, TROPONINI in the last 168 hours. BNP (last 3 results) No results for input(s): PROBNP in the last 8760 hours. HbA1C: No results for input(s): HGBA1C in the last 72 hours. CBG: No results for input(s): GLUCAP in the last 168 hours. Lipid Profile: No results for input(s): CHOL, HDL, LDLCALC, TRIG, CHOLHDL, LDLDIRECT in the last 72 hours. Thyroid Function Tests: Recent Labs    01/22/20 0435  TSH 0.444   Anemia Panel: No results for input(s): VITAMINB12, FOLATE, FERRITIN, TIBC, IRON, RETICCTPCT in the last 72 hours. Sepsis Labs: Recent Labs  Lab 01/22/20 0435  PROCALCITON 0.30  Recent Results (from the past 240 hour(s))  SARS Coronavirus 2 by RT PCR (hospital order, performed in [facility] hospital lab) Nasopharyngeal Nasopharyngeal Swab     Status: None   Collection Time: 01/21/20  1:58 PM   Specimen: Nasopharyngeal Swab  Result Value Ref Range Status   SARS Coronavirus 2 NEGATIVE NEGATIVE Final    Comment: (NOTE) SARS-CoV-2 target nucleic acids are NOT DETECTED.  The SARS-CoV-2 RNA is generally detectable in upper and lower respiratory specimens during the acute phase of infection. The lowest concentration of SARS-CoV-2 viral copies this assay can detect is 250 copies / mL. A negative result does not preclude SARS-CoV-2 infection and should not be used as the sole basis for treatment or other patient management decisions.  A negative result may occur with improper specimen collection / handling, submission of specimen other than nasopharyngeal swab, presence of viral mutation(s) within the areas targeted by this assay, and inadequate  number of viral copies (<250 copies / mL). A negative result must be combined with clinical observations, patient history, and epidemiological information.  Fact Sheet for Patients:   BoilerBrush.com.cy  Fact Sheet for Healthcare Providers: https://pope.com/  This test is not yet approved or  cleared by the [place] FDA and has been authorized for detection and/or diagnosis of SARS-CoV-2 by FDA under an Emergency Use Authorization (EUA).  This EUA will remain in effect (meaning this test can be used) for the duration of the COVID-19 declaration under Section 564(b)(1) of the Act, 21 U.S.C. section 360bbb-3(b)(1), unless the authorization is terminated or revoked sooner.  Performed at [facility], [address] [place], [place] [address]   Blood culture (routine x 2)     Status: None (Preliminary result)   Collection Time: 01/21/20  5:32 PM   Specimen: BLOOD  Result Value Ref Range Status   Specimen Description BLOOD BLOOD RIGHT FOREARM  Final   Special Requests   Final    BOTTLES DRAWN AEROBIC AND ANAEROBIC Blood Culture adequate volume   Culture   Final    NO GROWTH < 24 HOURS Performed at [facility], [address] [place], [place] [address]    Report Status PENDING  Incomplete  Blood culture (routine x 2)     Status: None (Preliminary result)   Collection Time: 01/21/20  5:32 PM   Specimen: BLOOD  Result Value Ref Range Status   Specimen Description BLOOD BLOOD LEFT FOREARM  Final   Special Requests   Final    BOTTLES DRAWN AEROBIC AND ANAEROBIC Blood Culture adequate volume   Culture   Final    NO GROWTH < 24 HOURS Performed at [facility], [address], [place], [place] [address]    Report Status PENDING  Incomplete    RN Pressure Injury Documentation:     Estimated body mass index is 20.11 kg/m as calculated from the following:   Height as of this encounter: 5\' 8"  (1.727  m).   Weight as of this encounter: 60 kg.  Malnutrition Type:      Malnutrition Characteristics:      Nutrition Interventions:     Radiology Studies: CT Angio Chest PE W and/or Wo Contrast  Result Date: 01/21/2020 CLINICAL DATA:  Shortness of breath.  AFib with RVR. EXAM: CT ANGIOGRAPHY CHEST WITH CONTRAST TECHNIQUE: Multidetector CT imaging of the chest was performed using the standard protocol during bolus administration of intravenous contrast. Multiplanar CT image reconstructions and MIPs were obtained to evaluate the vascular  anatomy. CONTRAST:  75mL OMNIPAQUE IOHEXOL 350 MG/ML SOLN COMPARISON:  June 20, 2016. FINDINGS: Cardiovascular: Satisfactory opacification of the pulmonary arteries to the segmental level. No evidence of pulmonary embolism. Limited evaluation secondary to respiratory motion. Mild cardiomegaly. No pericardial effusion. Three-vessel coronary artery atherosclerotic calcifications. Aortic valve calcifications. Moderate to severe atherosclerotic calcifications throughout the aorta. Mediastinum/Nodes: No suspicious mediastinal lymph nodes. No suspicious axillary lymph nodes. Thyroid is unremarkable. Lungs/Pleura: Centrilobular emphysema. There is diffuse interlobular septal thickening. There is bronchial wall thickening. Small bilateral pleural effusions. There is more confluent bibasilar consolidative and ground-glass opacities in the dependent lungs. Unchanged RIGHT apical greater than LEFT apical scarring. Scattered debris within the trachea and bronchi. Evaluation for fine parenchymal detail is limited secondary to extensive respiratory motion. Upper Abdomen: No acute abnormality. Musculoskeletal: No chest wall abnormality. No acute or significant osseous findings. Unchanged sclerotic lesion of the LEFT posterior seventh rib, likely a bone island. Review of the MIP images confirms the above findings. IMPRESSION: 1. No evidence of acute pulmonary embolism. 2. Small  bilateral pleural effusions with diffuse interlobular septal thickening and bronchial wall thickening, consistent with pulmonary edema. More confluent bibasilar consolidative and ground-glass opacities may reflect aspiration or infection. Aortic Atherosclerosis (ICD10-I70.0) and Emphysema (ICD10-J43.9). Electronically Signed   By: Meda Klinefelter MD   On: 01/21/2020 15:56   DG Chest Portable 1 View  Result Date: 01/21/2020 CLINICAL DATA:  Shortness of breath. EXAM: PORTABLE CHEST 1 VIEW COMPARISON:  June 20, 2016. FINDINGS: Stable cardiomediastinal silhouette. No pneumothorax is noted. Diffuse interstitial densities are noted throughout both lungs concerning for pulmonary edema. Minimal pleural effusions may be present. Bony thorax is unremarkable. IMPRESSION: Findings consistent with bilateral pulmonary edema. Electronically Signed   By: Lupita Raider M.D.   On: 01/21/2020 14:16   ECHOCARDIOGRAM COMPLETE  Result Date: 01/22/2020    ECHOCARDIOGRAM REPORT   Patient Name:   Ross Fisher Date of Exam: 01/22/2020 Medical Rec #:  [id number]        Height:       68.0 in Accession #:    [id number]       Weight:       132.3 lb Date of Birth:  [birth year]        BSA:          1.714 m Patient Age:    74 years         BP:           105/74 mmHg Patient Gender: M                HR:           73 bpm. Exam Location:  [facility] Procedure: 2D Echo, Cardiac Doppler and Color Doppler Indications:     CHF- acute diastolic 428.31  History:         Patient has no prior history of Echocardiogram examinations.                  COPD; Risk Factors:Hypertension.  Sonographer:     Cristela Blue RDCS (AE) Referring Phys:  [id number] Alford Highland Diagnosing Phys: Alwyn Pea MD IMPRESSIONS  1. Left ventricular ejection fraction, by estimation, is 50 to 55%. The left ventricle has low normal function. The left ventricle has no regional wall motion abnormalities. Left ventricular diastolic parameters were normal.  2. Right  ventricular systolic function is normal. The right ventricular size is normal.  3. The mitral valve is normal in structure. Trivial mitral valve  regurgitation.  4. The aortic valve is normal in structure. Aortic valve regurgitation is not visualized. FINDINGS  Left Ventricle: Left ventricular ejection fraction, by estimation, is 50 to 55%. The left ventricle has low normal function. The left ventricle has no regional wall motion abnormalities. The left ventricular internal cavity size was normal in size. There is no left ventricular hypertrophy. Left ventricular diastolic parameters were normal. Right Ventricle: The right ventricular size is normal. No increase in right ventricular wall thickness. Right ventricular systolic function is normal. Left Atrium: Left atrial size was normal in size. Right Atrium: Right atrial size was normal in size. Pericardium: There is no evidence of pericardial effusion. Mitral Valve: The mitral valve is normal in structure. Trivial mitral valve regurgitation. Tricuspid Valve: The tricuspid valve is normal in structure. Tricuspid valve regurgitation is trivial. Aortic Valve: The aortic valve is normal in structure. Aortic valve regurgitation is not visualized. Aortic valve mean gradient measures 2.0 mmHg. Aortic valve peak gradient measures 3.5 mmHg. Aortic valve area, by VTI measures 2.23 cm. Pulmonic Valve: The pulmonic valve was grossly normal. Pulmonic valve regurgitation is not visualized. Aorta: Aortic root could not be assessed. IAS/Shunts: No atrial level shunt detected by color flow Doppler.  LEFT VENTRICLE PLAX 2D LVIDd:         4.08 cm LVIDs:         3.06 cm LV PW:         1.16 cm LV IVS:        0.89 cm LVOT diam:     2.00 cm LV SV:         34 LV SV Index:   20 LVOT Area:     3.14 cm  RIGHT VENTRICLE RV Basal diam:  3.60 cm RV S prime:     17.10 cm/s TAPSE (M-mode): 3.6 cm LEFT ATRIUM             Index       RIGHT ATRIUM           Index LA diam:        3.70 cm 2.16 cm/m   RA Area:     23.20 cm LA Vol (A2C):   70.3 ml 41.01 ml/m RA Volume:   71.90 ml  41.94 ml/m LA Vol (A4C):   61.7 ml 35.99 ml/m LA Biplane Vol: 68.5 ml 39.96 ml/m  AORTIC VALVE                   PULMONIC VALVE AV Area (Vmax):    2.25 cm    PV Vmax:        0.68 m/s AV Area (Vmean):   2.11 cm    PV Peak grad:   1.8 mmHg AV Area (VTI):     2.23 cm    RVOT Peak grad: 2 mmHg AV Vmax:           93.95 cm/s AV Vmean:          60.550 cm/s AV VTI:            0.152 m AV Peak Grad:      3.5 mmHg AV Mean Grad:      2.0 mmHg LVOT Vmax:         67.20 cm/s LVOT Vmean:        40.600 cm/s LVOT VTI:          0.108 m LVOT/AV VTI ratio: 0.71  AORTA Ao Root diam: 3.30 cm MITRAL VALVE  TRICUSPID VALVE MV Area (PHT): 5.23 cm     TR Peak grad:   31.6 mmHg MV Decel Time: 145 msec     TR Vmax:        281.00 cm/s MV E velocity: 100.00 cm/s                             SHUNTS                             Systemic VTI:  0.11 m                             Systemic Diam: 2.00 cm Dwayne Salome Arnt MD Electronically signed by Alwyn Pea MD Signature Date/Time: 01/22/2020/5:36:52 PM    Final    Scheduled Meds: . aspirin EC  81 mg Oral Daily  . carvedilol  12.5 mg Oral BID WC  . diltiazem  60 mg Oral Q8H  . enoxaparin (LOVENOX) injection  1 mg/kg Subcutaneous Q12H  . furosemide  40 mg Intravenous BID  . gabapentin  800 mg Oral Q8H  . losartan  25 mg Oral Daily  . methylPREDNISolone (SOLU-MEDROL) injection  40 mg Intravenous Q12H  . mirtazapine  30 mg Oral QHS  . mometasone-formoterol  2 puff Inhalation BID  . nicotine  21 mg Transdermal Daily  . sodium chloride flush  3 mL Intravenous Q12H  . traZODone  200 mg Oral QHS   Continuous Infusions: . sodium chloride    . cefTRIAXone (ROCEPHIN)  IV Stopped (01/22/20 1741)    LOS: 1 day   Merlene Laughter, DO [facility] PAGER is on AMION  If 7PM-7AM, please contact night-coverage [email or web address]

## 2020-01-23 ENCOUNTER — Inpatient Hospital Stay: Payer: Medicare Other

## 2020-01-23 DIAGNOSIS — I5031 Acute diastolic (congestive) heart failure: Secondary | ICD-10-CM

## 2020-01-23 LAB — CBC WITH DIFFERENTIAL/PLATELET
Abs Immature Granulocytes: 0.08 10*3/uL — ABNORMAL HIGH (ref 0.00–0.07)
Basophils Absolute: 0 10*3/uL (ref 0.0–0.1)
Basophils Relative: 0 %
Eosinophils Absolute: 0 10*3/uL (ref 0.0–0.5)
Eosinophils Relative: 0 %
HCT: 39.1 % (ref 39.0–52.0)
Hemoglobin: 13.2 g/dL (ref 13.0–17.0)
Immature Granulocytes: 1 %
Lymphocytes Relative: 4 %
Lymphs Abs: 0.5 10*3/uL — ABNORMAL LOW (ref 0.7–4.0)
MCH: 32.4 pg (ref 26.0–34.0)
MCHC: 33.8 g/dL (ref 30.0–36.0)
MCV: 95.8 fL (ref 80.0–100.0)
Monocytes Absolute: 0.4 10*3/uL (ref 0.1–1.0)
Monocytes Relative: 3 %
Neutro Abs: 12.2 10*3/uL — ABNORMAL HIGH (ref 1.7–7.7)
Neutrophils Relative %: 92 %
Platelets: 258 10*3/uL (ref 150–400)
RBC: 4.08 MIL/uL — ABNORMAL LOW (ref 4.22–5.81)
RDW: 15.9 % — ABNORMAL HIGH (ref 11.5–15.5)
WBC: 13.2 10*3/uL — ABNORMAL HIGH (ref 4.0–10.5)
nRBC: 0 % (ref 0.0–0.2)

## 2020-01-23 LAB — COMPREHENSIVE METABOLIC PANEL
ALT: 49 U/L — ABNORMAL HIGH (ref 0–44)
AST: 41 U/L (ref 15–41)
Albumin: 3.4 g/dL — ABNORMAL LOW (ref 3.5–5.0)
Alkaline Phosphatase: 55 U/L (ref 38–126)
Anion gap: 10 (ref 5–15)
BUN: 30 mg/dL — ABNORMAL HIGH (ref 8–23)
CO2: 29 mmol/L (ref 22–32)
Calcium: 8.3 mg/dL — ABNORMAL LOW (ref 8.9–10.3)
Chloride: 102 mmol/L (ref 98–111)
Creatinine, Ser: 1.21 mg/dL (ref 0.61–1.24)
GFR calc Af Amer: >60 mL/min (ref >60)
GFR calc non Af Amer: 59 mL/min — ABNORMAL LOW (ref >60)
Glucose, Bld: 125 mg/dL — ABNORMAL HIGH (ref 70–99)
Potassium: 3.6 mmol/L (ref 3.5–5.1)
Sodium: 141 mmol/L (ref 135–145)
Total Bilirubin: 0.8 mg/dL (ref 0.3–1.2)
Total Protein: 6.6 g/dL (ref 6.5–8.1)

## 2020-01-23 LAB — PHOSPHORUS: Phosphorus: 3 mg/dL (ref 2.5–4.6)

## 2020-01-23 LAB — MAGNESIUM: Magnesium: 2.2 mg/dL (ref 1.7–2.4)

## 2020-01-23 LAB — PROCALCITONIN: Procalcitonin: 0.3 ng/mL

## 2020-01-23 MED ORDER — ENOXAPARIN SODIUM 60 MG/0.6ML ~~LOC~~ SOLN
55.0000 mg | Freq: Two times a day (BID) | SUBCUTANEOUS | Status: DC
  Filled 2020-01-23: qty 0.6

## 2020-01-23 MED ORDER — DILTIAZEM HCL ER COATED BEADS 180 MG PO CP24: 180.0000 mg | ORAL_CAPSULE | Freq: Every day | ORAL | Status: DC

## 2020-01-23 MED ORDER — APIXABAN 5 MG PO TABS: 5.0000 mg | ORAL_TABLET | Freq: Two times a day (BID) | ORAL | Status: DC

## 2020-01-23 NOTE — Progress Notes (Addendum)
[facility] Cardiology    SUBJECTIVE: Persistent shortness of breath on supplemental oxygen and inhaler therapy denies any discrete chest pain symptoms   Vitals:   01/22/20 1854 01/22/20 1919 01/23/20 0506 01/23/20 0830  BP: 125/84 125/72 124/86 128/80  Pulse: 93 84 79 69  Resp: 18   18  Temp: 98.1 F (36.7 C) 98.9 F (37.2 C) 97.8 F (36.6 C) 98.1 F (36.7 C)  TempSrc: Oral Oral Oral Oral  SpO2: 96% 95% 97% 97%  Weight: 56.3 kg  55.6 kg   Height: 5\' 8"  (1.727 m)        Intake/Output Summary (Last 24 hours) at 01/23/2020 01/25/2020 Last data filed at 01/23/2020 0830 Gross per 24 hour  Intake 347.62 ml  Output 1650 ml  Net -1302.38 ml      PHYSICAL EXAM  General: Well developed, well nourished, in no acute distress HEENT:  Normocephalic and atramatic Neck:  No JVD.  Lungs: Diminished bilaterally to auscultation and percussion. Heart: HRRR . Normal S1 and S2 without gallops or murmurs.  Abdomen: Bowel sounds are positive, abdomen soft and non-tender  Msk:  Back normal, normal gait. Normal strength and tone for age. Extremities: No clubbing, cyanosis or edema.   Neuro: Alert and oriented X 3. Psych:  Good affect, responds appropriately   LABS: Basic Metabolic Panel: Recent Labs    01/22/20 0435 01/23/20 0358  NA 137 141  K 4.1 3.6  CL 103 102  CO2 21* 29  GLUCOSE 131* 125*  BUN 21 30*  CREATININE 1.06 1.21  CALCIUM 8.2* 8.3*  MG  --  2.2  PHOS  --  3.0   Liver Function Tests: Recent Labs    01/21/20 1358 01/23/20 0358  AST 59* 41  ALT 66* 49*  ALKPHOS 75 55  BILITOT 1.5* 0.8  PROT 7.3 6.6  ALBUMIN 4.1 3.4*   No results for input(s): LIPASE, AMYLASE in the last 72 hours. CBC: Recent Labs    01/21/20 1358 01/21/20 1358 01/22/20 0435 01/23/20 0358  WBC 9.9   < > 8.1 13.2*  NEUTROABS 8.1*  --   --  12.2*  HGB 13.9   < > 12.9* 13.2  HCT 40.1   < > 35.8* 39.1  MCV 94.4   < > 92.0 95.8  PLT 269   < > 230 258   < > = values in this interval not  displayed.   Cardiac Enzymes: No results for input(s): CKTOTAL, CKMB, CKMBINDEX, TROPONINI in the last 72 hours. BNP: Invalid input(s): POCBNP D-Dimer: No results for input(s): DDIMER in the last 72 hours. Hemoglobin A1C: No results for input(s): HGBA1C in the last 72 hours. Fasting Lipid Panel: No results for input(s): CHOL, HDL, LDLCALC, TRIG, CHOLHDL, LDLDIRECT in the last 72 hours. Thyroid Function Tests: Recent Labs    01/22/20 0435  TSH 0.444   Anemia Panel: No results for input(s): VITAMINB12, FOLATE, FERRITIN, TIBC, IRON, RETICCTPCT in the last 72 hours.  DG Chest 1 View  Result Date: 01/23/2020 CLINICAL DATA:  Shortness of breath EXAM: CHEST  1 VIEW COMPARISON:  01/21/2020 FINDINGS: Cardiac shadow is stable. Aortic calcifications are again seen. Diffuse vascular congestion with interstitial edema is again identified stable from the prior study. No new focal infiltrate is seen. No bony abnormality is noted. IMPRESSION: Stable vascular congestion and interstitial edema. Electronically Signed   By: 01/23/2020 M.D.   On: 01/23/2020 03:50   CT Angio Chest PE W and/or Wo Contrast  Result Date: 01/21/2020  CLINICAL DATA:  Shortness of breath.  AFib with RVR. EXAM: CT ANGIOGRAPHY CHEST WITH CONTRAST TECHNIQUE: Multidetector CT imaging of the chest was performed using the standard protocol during bolus administration of intravenous contrast. Multiplanar CT image reconstructions and MIPs were obtained to evaluate the vascular anatomy. CONTRAST:  62mL OMNIPAQUE IOHEXOL 350 MG/ML SOLN COMPARISON:  June 20, 2016. FINDINGS: Cardiovascular: Satisfactory opacification of the pulmonary arteries to the segmental level. No evidence of pulmonary embolism. Limited evaluation secondary to respiratory motion. Mild cardiomegaly. No pericardial effusion. Three-vessel coronary artery atherosclerotic calcifications. Aortic valve calcifications. Moderate to severe atherosclerotic calcifications  throughout the aorta. Mediastinum/Nodes: No suspicious mediastinal lymph nodes. No suspicious axillary lymph nodes. Thyroid is unremarkable. Lungs/Pleura: Centrilobular emphysema. There is diffuse interlobular septal thickening. There is bronchial wall thickening. Small bilateral pleural effusions. There is more confluent bibasilar consolidative and ground-glass opacities in the dependent lungs. Unchanged RIGHT apical greater than LEFT apical scarring. Scattered debris within the trachea and bronchi. Evaluation for fine parenchymal detail is limited secondary to extensive respiratory motion. Upper Abdomen: No acute abnormality. Musculoskeletal: No chest wall abnormality. No acute or significant osseous findings. Unchanged sclerotic lesion of the LEFT posterior seventh rib, likely a bone island. Review of the MIP images confirms the above findings. IMPRESSION: 1. No evidence of acute pulmonary embolism. 2. Small bilateral pleural effusions with diffuse interlobular septal thickening and bronchial wall thickening, consistent with pulmonary edema. More confluent bibasilar consolidative and ground-glass opacities may reflect aspiration or infection. Aortic Atherosclerosis (ICD10-I70.0) and Emphysema (ICD10-J43.9). Electronically Signed   By: Meda Klinefelter MD   On: 01/21/2020 15:56   DG Chest Portable 1 View  Result Date: 01/21/2020 CLINICAL DATA:  Shortness of breath. EXAM: PORTABLE CHEST 1 VIEW COMPARISON:  June 20, 2016. FINDINGS: Stable cardiomediastinal silhouette. No pneumothorax is noted. Diffuse interstitial densities are noted throughout both lungs concerning for pulmonary edema. Minimal pleural effusions may be present. Bony thorax is unremarkable. IMPRESSION: Findings consistent with bilateral pulmonary edema. Electronically Signed   By: Lupita Raider M.D.   On: 01/21/2020 14:16   ECHOCARDIOGRAM COMPLETE  Result Date: 01/22/2020    ECHOCARDIOGRAM REPORT   Patient Name:   Ross Fisher  Date of Exam: 01/22/2020 Medical Rec #:  [id number]        Height:       68.0 in Accession #:    [id number]       Weight:       132.3 lb Date of Birth:  Sep 29, 1945        BSA:          1.714 m Patient Age:    74 years         BP:           105/74 mmHg Patient Gender: M                HR:           73 bpm. Exam Location:  [facility] Procedure: 2D Echo, Cardiac Doppler and Color Doppler Indications:     CHF- acute diastolic 428.31  History:         Patient has no prior history of Echocardiogram examinations.                  COPD; Risk Factors:Hypertension.  Sonographer:     Cristela Blue RDCS (AE) Referring Phys:  [id number] Alford Highland Diagnosing Phys: Alwyn Pea MD IMPRESSIONS  1. Left ventricular ejection fraction, by estimation, is 50 to 55%. The  left ventricle has low normal function. The left ventricle has no regional wall motion abnormalities. Left ventricular diastolic parameters were normal.  2. Right ventricular systolic function is normal. The right ventricular size is normal.  3. The mitral valve is normal in structure. Trivial mitral valve regurgitation.  4. The aortic valve is normal in structure. Aortic valve regurgitation is not visualized. FINDINGS  Left Ventricle: Left ventricular ejection fraction, by estimation, is 50 to 55%. The left ventricle has low normal function. The left ventricle has no regional wall motion abnormalities. The left ventricular internal cavity size was normal in size. There is no left ventricular hypertrophy. Left ventricular diastolic parameters were normal. Right Ventricle: The right ventricular size is normal. No increase in right ventricular wall thickness. Right ventricular systolic function is normal. Left Atrium: Left atrial size was normal in size. Right Atrium: Right atrial size was normal in size. Pericardium: There is no evidence of pericardial effusion. Mitral Valve: The mitral valve is normal in structure. Trivial mitral valve regurgitation. Tricuspid Valve: The  tricuspid valve is normal in structure. Tricuspid valve regurgitation is trivial. Aortic Valve: The aortic valve is normal in structure. Aortic valve regurgitation is not visualized. Aortic valve mean gradient measures 2.0 mmHg. Aortic valve peak gradient measures 3.5 mmHg. Aortic valve area, by VTI measures 2.23 cm. Pulmonic Valve: The pulmonic valve was grossly normal. Pulmonic valve regurgitation is not visualized. Aorta: Aortic root could not be assessed. IAS/Shunts: No atrial level shunt detected by color flow Doppler.  LEFT VENTRICLE PLAX 2D LVIDd:         4.08 cm LVIDs:         3.06 cm LV PW:         1.16 cm LV IVS:        0.89 cm LVOT diam:     2.00 cm LV SV:         34 LV SV Index:   20 LVOT Area:     3.14 cm  RIGHT VENTRICLE RV Basal diam:  3.60 cm RV S prime:     17.10 cm/s TAPSE (M-mode): 3.6 cm LEFT ATRIUM             Index       RIGHT ATRIUM           Index LA diam:        3.70 cm 2.16 cm/m  RA Area:     23.20 cm LA Vol (A2C):   70.3 ml 41.01 ml/m RA Volume:   71.90 ml  41.94 ml/m LA Vol (A4C):   61.7 ml 35.99 ml/m LA Biplane Vol: 68.5 ml 39.96 ml/m  AORTIC VALVE                   PULMONIC VALVE AV Area (Vmax):    2.25 cm    PV Vmax:        0.68 m/s AV Area (Vmean):   2.11 cm    PV Peak grad:   1.8 mmHg AV Area (VTI):     2.23 cm    RVOT Peak grad: 2 mmHg AV Vmax:           93.95 cm/s AV Vmean:          60.550 cm/s AV VTI:            0.152 m AV Peak Grad:      3.5 mmHg AV Mean Grad:      2.0 mmHg LVOT Vmax:  67.20 cm/s LVOT Vmean:        40.600 cm/s LVOT VTI:          0.108 m LVOT/AV VTI ratio: 0.71  AORTA Ao Root diam: 3.30 cm MITRAL VALVE                TRICUSPID VALVE MV Area (PHT): 5.23 cm     TR Peak grad:   31.6 mmHg MV Decel Time: 145 msec     TR Vmax:        281.00 cm/s MV E velocity: 100.00 cm/s                             SHUNTS                             Systemic VTI:  0.11 m                             Systemic Diam: 2.00 cm Tarren Sabree D Sher Hellinger MD Electronically signed by  Alwyn Pea MD Signature Date/Time: 01/22/2020/5:36:52 PM    Final      Echo preserved overall left ventricular function 50 to 55%  TELEMETRY: Appears to be atrial fibrillation rapid ventricular response:  ASSESSMENT AND PLAN:  Active Problems:   Acute hypoxemic respiratory failure (HCC)   Atrial fibrillation with RVR (HCC) Respiratory failure Smoking COPD Hypertension Hypoxemia Hyperlipidemia Acute on chronic congestive heart failure  Plan Continue supplemental oxygen respiratory support including inhalers Agree with broad-spectrum antibiotic therapy for bronchitis Steroid therapy for significant chronic inflammatory lung disease Maintain blood pressure control with beta-blockers Cardizem as necessary Short-term anticoagulation probably with heparin because of atrial fibrillation Continue statin therapy for hyperlipidemia Advised patient refrain from tobacco abuse Do not recommend any invasive cardiac studies at this point Maintain a conservative cardiac position just rate control and blood pressure monitoring and anticoagulation for A. Fib Diuretic therapy as necessary for heart failure Consider repeating BMP to see if it is trending downwards   Alwyn Pea, MD 01/23/2020 9:03 AM

## 2020-01-23 NOTE — Evaluation (Signed)
Physical Therapy Evaluation Patient Details Name: Ross Fisher MRN: [id number] DOB: [birth year] Today's Date: 01/23/2020   History of Present Illness  Pt is a 74 y.o. male admitted with complaints of SOB over a few weeks.  In the ER he was found to be in rapid atrial fibrillation and congestive heart failure.  His D-dimer was elevated so he did a CAT scan of the chest which was negative for pulmonary embolism.  Patient was placed on BiPAP secondary to respiratory distress.    Clinical Impression  Pt is a 74 year old M admitted to hospital on 01/21/20 for complaints of worsening SOB over a few weeks. At baseline, pt is independent with all ADL's, IADL's, and community ambulation without AD; pt lives with mother who he cares for. Pt presents with adequate strength, balance, and safety awareness required for safe functional mobility. Pt provided grossly supervision assistance for safety during ambulation and stair training, but is able to be mod I. 4-item DGI score of 11/12 indicates pt is at low risk of falls (>10/12 indicating low risk of falls). Pt able to ambulate 265ft and navigate 4 steps on RA with SpO2 at 94% and no pt complaints of SOB/DOE; RN notified. Pt educated regarding activity modification at home for safety and energy conservation; he verbalized understanding and confirmed that he has been practicing activity modification. Due to pt being at baseline function with good safety awareness, he is not a candidate for skilled acute PT at this time; please reconsult with any changes in status. At this time, PT recommends DC home with no further PT needs; pt agreeable to PT recommendation.     Follow Up Recommendations No PT follow up    Equipment Recommendations  None recommended by PT    Recommendations for Other Services       Precautions / Restrictions Precautions Precautions: Fall Precaution Comments: DNR, Strict I/O Restrictions Weight Bearing Restrictions: No       Mobility  Bed Mobility Overal bed mobility: Modified Independent                Transfers Overall transfer level: Needs assistance Equipment used: None Transfers: Sit to/from Stand Sit to Stand: Supervision         General transfer comment: Supervision for safety to stand from EOB without AD; able to be mod I with use of BUE for support.  Ambulation/Gait Ambulation/Gait assistance: Supervision Gait Distance (Feet): 250 Feet Assistive device: None       General Gait Details: Pt required supervision for safety to ambulate in hallway without AD. Pt demonstrated reciprocal gait pattern with decreased foot clearance bil. Intermittent veering noted with head turns during 4-item DGI testing, however, pt at low risk of falls indicated by score of 11/12.  Stairs Stairs: Yes Stairs assistance: Supervision Stair Management: One rail Right;Alternating pattern;One rail Left   General stair comments: Supervision for safety to navigate 4 steps with reciprocal gait pattern and unilateral UE support on hand rail  Wheelchair Mobility    Modified Rankin (Stroke Patients Only)       Balance Overall balance assessment: Modified Independent   Sitting balance-Leahy Scale: Good     Standing balance support: During functional activity Standing balance-Leahy Scale: Good Standing balance comment: Supervision for safety                             Pertinent Vitals/Pain Pain Assessment: No/denies pain    Home Living Family/patient  expects to be discharged to:: Private residence Living Arrangements: Parent Available Help at Discharge: Family;Available PRN/intermittently Type of Home: House Home Access: Stairs to enter Entrance Stairs-Rails: Doctor, general practice of Steps: 3STE Home Layout: One level Home Equipment: Walker - 2 wheels;Shower seat - built in;Grab bars - tub/shower;Grab bars - toilet Additional Comments: Pt reports home is handicap  accessible secondary to father's needs before death.    Prior Function Level of Independence: Independent         Comments: Pt is retired and still drives. He provides some assist to mother when she needs it.     Hand Dominance   Dominant Hand: Right    Extremity/Trunk Assessment   Upper Extremity Assessment Upper Extremity Assessment: Overall WFL for tasks assessed    Lower Extremity Assessment Lower Extremity Assessment: Overall WFL for tasks assessed (Reports onset of N/T along L lat femoral cutaneous N 1 year ago)    Cervical / Trunk Assessment Cervical / Trunk Assessment: Normal  Communication   Communication: HOH  Cognition Arousal/Alertness: Awake/alert Behavior During Therapy: WFL for tasks assessed/performed Overall Cognitive Status: Within Functional Limits for tasks assessed                                        General Comments      Exercises     Assessment/Plan    PT Assessment Patent does not need any further PT services  PT Problem List         PT Treatment Interventions      PT Goals (Current goals can be found in the Care Plan section)  Acute Rehab PT Goals Patient Stated Goal: to return home PT Goal Formulation: With patient    Frequency     Barriers to discharge        Co-evaluation               AM-PAC PT "6 Clicks" Mobility  Outcome Measure Help needed turning from your back to your side while in a flat bed without using bedrails?: None Help needed moving from lying on your back to sitting on the side of a flat bed without using bedrails?: None Help needed moving to and from a bed to a chair (including a wheelchair)?: None Help needed standing up from a chair using your arms (e.g., wheelchair or bedside chair)?: None Help needed to walk in hospital room?: None Help needed climbing 3-5 steps with a railing? : None 6 Click Score: 24    End of Session Equipment Utilized During Treatment: Gait  belt Activity Tolerance: Patient tolerated treatment well Patient left: in bed;with bed alarm set;with call bell/phone within reach Nurse Communication: Mobility status (O2 sat during activity) PT Visit Diagnosis: Unsteadiness on feet (R26.81);Muscle weakness (generalized) (M62.81)    Time: 2992-4268 PT Time Calculation (min) (ACUTE ONLY): 23 min   Charges:   PT Evaluation $PT Eval Low Complexity: 1 Low PT Treatments $Therapeutic Activity: 8-22 mins      Vira Blanco, PT, DPT 3:50 PM,01/23/20

## 2020-01-23 NOTE — Evaluation (Signed)
Occupational Therapy Evaluation Patient Details Name: Ross Fisher MRN: [id number] DOB: April 15, 1946 Today's Date: 01/23/2020    History of Present Illness 74 y.o. male coming in with a few weeks of shortness of breath.  Patient states he cannot breathe.  Slight cough.  Slight nausea but no vomiting.  No chest pain.  No palpitations.  In the ER he was found to be in rapid atrial fibrillation and congestive heart failure.  His D-dimer was elevated so he did a CAT scan of the chest which was negative for pulmonary embolism.  Patient was placed on BiPAP secondary to respiratory distress.  Pt now on 2L O2 via  and tolerating.   Clinical Impression   Patient presenting with decreased I in self care, balance, functional mobility/transfer, endurance, and safety awareness.  Patient reports being independent without use of AD PTA. PT lives with mother whom he assists as needed. Patient currently functioning at supervision level for self care tasks and functional mobility. Patient will benefit from acute OT to increase overall independence in the areas of ADLs, functional mobility, and safety awareness in order to safely discharge home. Pt would benefit from energy conservation education.     Follow Up Recommendations  No OT follow up;Supervision - Intermittent    Equipment Recommendations  None recommended by OT    Recommendations for Other Services Other (comment) (none at this time)     Precautions / Restrictions Precautions Precautions: Fall      Mobility Bed Mobility Overal bed mobility: Modified Independent       Transfers Overall transfer level: Needs assistance Equipment used: None Transfers: Sit to/from [employer] Sit to Stand: Supervision Stand pivot transfers: Supervision       General transfer comment: min cuing for safety and pt reports feeling dizzy but passed quickly    Balance Overall balance assessment: Needs assistance Sitting-balance support:  Feet supported Sitting balance-Leahy Scale: Normal     Standing balance support: During functional activity Standing balance-Leahy Scale: Good Standing balance comment: supervision for safety       ADL either performed or assessed with clinical judgement   ADL Overall ADL's : Needs assistance/impaired Eating/Feeding: Independent   Grooming: Wash/dry hands;Wash/dry face;Oral care;Modified independent           Upper Body Dressing : Supervision/safety;Sitting   Lower Body Dressing: Supervision/safety;Sit to/from stand   Toilet Transfer: Supervision/safety;Ambulation             General ADL Comments: Pt is overall supervision with all aspects of care with min cuing for safety with oxygen line. No use of AD for functional ambulation/transfers needed.     Vision Baseline Vision/History: Wears glasses Wears Glasses: At all times Patient Visual Report: No change from baseline              Pertinent Vitals/Pain Pain Assessment: No/denies pain     Hand Dominance Right   Extremity/Trunk Assessment Upper Extremity Assessment Upper Extremity Assessment: Overall WFL for tasks assessed   Lower Extremity Assessment Lower Extremity Assessment: Overall WFL for tasks assessed   Cervical / Trunk Assessment Cervical / Trunk Assessment: Normal   Communication Communication Communication: HOH   Cognition Arousal/Alertness: Awake/alert Behavior During Therapy: WFL for tasks assessed/performed Overall Cognitive Status: Within Functional Limits for tasks assessed                   Home Living Family/patient expects to be discharged to:: Private residence Living Arrangements: Parent Available Help at Discharge: Family;Available PRN/intermittently Type  of Home: House Home Access: Stairs to enter [employer] of Steps: 3STE Entrance Stairs-Rails: Right;Left Home Layout: One level     Bathroom Shower/Tub: Chief Strategy Officer: Handicapped  height Bathroom Accessibility: Yes How Accessible: Accessible via walker Home Equipment: Walker - 2 wheels;Shower seat - built in;Grab bars - tub/shower;Grab bars - toilet   Additional Comments: Pt reports home is handicap accessible secondary to father's needs before death.      Prior Functioning/Environment Level of Independence: Independent        Comments: Pt is retired and still drives. He provides some assist to mother when she needs it.        OT Problem List: Cardiopulmonary status limiting activity;Decreased strength;Decreased activity tolerance;Decreased safety awareness;Decreased knowledge of use of DME or AE;Impaired balance (sitting and/or standing)      OT Treatment/Interventions: Self-care/ADL training;Therapeutic exercise;Therapeutic activities;Energy conservation;Neuromuscular education;Patient/family education;Balance training;DME and/or AE instruction    OT Goals(Current goals can be found in the care plan section) Acute Rehab OT Goals Patient Stated Goal: to return home OT Goal Formulation: With patient Time For Goal Achievement: 02/06/20 Potential to Achieve Goals: Good ADL Goals Pt Will Perform Grooming: with modified independence;standing Pt Will Transfer to Toilet: with modified independence;ambulating Pt Will Perform Toileting - Clothing Manipulation and hygiene: with modified independence;sit to/from stand Pt Will Perform Tub/Shower Transfer: Shower transfer;ambulating;with modified independence  OT Frequency: Min 1X/week   Barriers to D/C: Other (comment)  none known at this time          AM-PAC OT "6 Clicks" Daily Activity     Outcome Measure Help from another person eating meals?: None Help from another person taking care of personal grooming?: None Help from another person toileting, which includes using toliet, bedpan, or urinal?: A Little Help from another person bathing (including washing, rinsing, drying)?: A Little Help from another  person to put on and taking off regular upper body clothing?: None Help from another person to put on and taking off regular lower body clothing?: A Little 6 Click Score: 21   End of Session Equipment Utilized During Treatment: Oxygen Nurse Communication: Mobility status;Precautions  Activity Tolerance: Patient tolerated treatment well Patient left: in chair;with chair alarm set;with call bell/phone within reach  OT Visit Diagnosis: Other (comment) (cardiopulmonary status)                Time: 2094-7096 OT Time Calculation (min): 20 min Charges:  OT General Charges $OT Visit: 1 Visit OT Evaluation $OT Eval Low Complexity: 1 Low OT Treatments $Self Care/Home Management : 8-22 mins  Jackquline Denmark, MS, OTR/L , CBIS ascom [phone]  01/23/20, 11:17 AM

## 2020-01-23 NOTE — Plan of Care (Signed)
Patient insisted to leave AMA with out waiting for Dr. To finish his DC papers.  Though Dr. Was not ready for patient to leave d/t treatment not being completed - Patient needed prescriptions and possibly a f/u with cardiology to assist with future needs.  Patient was also educated and advised that leaving without completing his treatments may cause increased problems with his heart and possibly even death.  Patient stated, " I'd rather die at home and I know the risks."    IVs and tele removed. Signed AMA papers and was escorted to medical mall to wait for his uber.

## 2020-01-23 NOTE — Progress Notes (Signed)
Mobility Specialist - Progress Note   01/23/20 1500  Mobility  Activity Refused mobility  Mobility performed by Mobility specialist    Pt declined mobility. Pt had just finished PT session. Will attempt at a later date/time.    Filiberto Pinks Mobility Specialist 01/23/20, 3:22 PM

## 2020-01-23 NOTE — Discharge Summary (Signed)
Physician Discharge Summary  Ross Fisher NWG:[id number] DOB: 1946/01/27 DOA: 01/21/2020  PCP: Ross Bumpers, PA-C  Admit date: 01/21/2020 Discharge date: 01/23/2020  Admitted From: Home Disposition: Left AMA  Recommendations for Outpatient Follow-up:  1. Follow up with PCP in 1-2 weeks 2. Follow up with Cardiology within 1-2 weeks 3. Please obtain CMP/CBC, Mag, Phos in one week 4. For chest x-ray in 3 to 6 weeks 5. Please follow up on the following pending results:  Home Health: No  Equipment/Devices: None  Discharge Condition: Guarded, Poor CODE STATUS: DO NOT RESUSCITATE Diet recommendation: Heart Healthy Diet with 1500 mL Fluid Restriction  Brief/Interim Summary: HPI per Ross Fisher on 01/21/20 [name] a74 y.o.malecoming in with a few weeks of shortness of breath. Patient states he cannot breathe. Slight cough. Slight nausea but no vomiting. No chest pain. No palpitations. In the ER he was found to be in rapid atrial fibrillation and congestive heart failure. His D-dimer was elevated so he did a CAT scan of the chest which was negative for pulmonary embolism. Patient was placed on BiPAP secondary to respiratory distress. Patient feeling slightly better after placed on BiPAP. Hospitalist services were contacted for further evaluation  **Interim History He was weaned off of his BiPAP to 2 L and his A. fib with RVR is improved and his Cardizem drip was transitioned to p.o. diltiazem.  We will repeat his LFTs in the morning and continue treatment for COPD as well as his A. fib with RVR and have cardiology weigh in about his volume overload.  Will be continuing IV diuretics at this time.  Today the patient felt well and his breathing had improved but cardiology still wanted to diurese him and they still needed to consolidate his diltiazem.  Ross Fisher was going to consolidate his diltiazem to 180 mg p.o. in the a.m. and patient will be started on  apixaban later this evening and Ross Fisher recommended repeating a BNP in the a.m. We wanted to watch the patient closely for another night however patient decided that he did not want to be hospitalized and decided to leave AGAINST MEDICAL ADVICE despite discussion about remain hospitalized closer monitoring and conditioning over his medications.  He was of sound mind and alert and oriented x4 and signed out AGAINST MEDICAL ADVICE  Discharge Diagnoses:  Active Problems:   Acute hypoxemic respiratory failure (HCC)   Atrial fibrillation with RVR (HCC)  Acute Diastolic congestive heart failure  -No prior echocardiogram to determine ejection fraction.  -Patient's BNP was 1455 and we are going to repeat in the morning -Patient ordered 60 mg of Lasix in the emergency room.  -Continue Lasix 40 mg IV twice daily and was going to be changed to p.o. in the morning.  -Patient already on Coreg and Cozaar was added.  -Echocardiogram and cardiology consultation ordered. -Echocardiogram did not mention any evidence of systolic heart failure and cardiology has been consulted and recommending continuing diuretic therapy -Strict I's and O's and daily weights; the patient is -3.302 L since admission weight was down almost 10 pounds -Repeat chest x-ray this AM showed "Stable vascular congestion and interstitial edema." -Patient is going be diuresed overnight and continued to be monitored however he subsequently signed out AGAINST MEDICAL ADVICE  Acute hypoxic respiratory failure with hypoxia requiring noninvasive positive pressure ventilation with BiPAP, now improved - Patient currently breathing better on BiPAP 40% FiO2.  -Hopefully can taper off BiPAP shortly. Initial Covid test negative. -Patient being weaned off of  BiPAP to 2 L and is improved -SpO2: 94 % O2 Flow Rate (L/min): 2 L/min FiO2 (%): 40 %  -C/w Dulera and Steroids and we will start weaning his steroids -Continuous pulse oximetry  maintain O2 saturations greater than 90% -Continue supplemental oxygen via nasal cannula and wean O2 as tolerated to room air -We will need an ambulatory home O2 screen prior to discharge but patient signed out AGAINST MEDICAL ADVICE  Atrial fibrillation with rapid ventricular response.  -Patient was started on Cardizem drip has been transitioned to p.o. Cardizem 60 mg every 8 hours cardiology and he is dose to be started on diltiazem 180 mg p.o. in the morning -We will add oral Coreg 12.5 mg p.o. twice daily and hopefully can come off Cardizem drip.  -Check a TSH.  -ER physician ordered IV magnesium.  -Placed on high-dose Lovenox and he will be switched apixaban around the evening he signed out AGAINST MEDICAL ADVICE -Cardiology consulted for further evaluation recommendations and they have made him an appointment in outpatient clinic but he has signed out AGAINST MEDICAL ADVICE  Elevated Troponin -Troponin Level was 254 -> 270 -In the setting of Demand Ischemia from A Fib with RVR and Acute Respiratory Failure -Continue to Monitor and denies Chest Pain -Cardiology following -Patient has signed out AGAINST MEDICAL ADVICE  Abnormal LFTs -AST on admission was 59 ALT is 66 and likely this is in the setting of passive hepatic congestion from his suspected heart failure -His LFTs were improving -Continue to monitor and trend function panel carefully and if necessary will obtain a right upper quadrant ultrasound as well as an acute hepatitis panel -Physical repeat CMP in the a.m. but he signed against medical advise  Hyperbilirubinemia -Mild with T Bili of 1.5 and now improved to 0.8 -Continue to Monitor and Trend -Monitor repeat CMP in a.m. but he signed out AGAINST MEDICAL ADVICE  Elevated D-dimer.  -CT scan of the chest negative for pulmonary embolism  COPD exacerbation with possible pneumonia.  -PCT was 0.30  -Start Rocephin and Zithromax. -C/w Dulera -C/w IV  Solumedrol 40 mg q12h and was going to start weaning but he signed out against medical advised  Essential Hypertension  -on carvedilol 12.5 mg p.o. twice daily as well as diltiazem 60 mg p.o. every 8 hours will be transitioned to Cardizem 180 mg p.o. daily in the morning -Also getting diuresis with IV Lasix 40 mg twice daily -Also is on losartan 25 mg p.o. daily  -Patient signed out AGAINST MEDICAL ADVICE  Hyperlipidemia  -Held Zocor while on Cardizem drip -Now that he is off the Cardizem drip likely can be resumed -Resume statin but he left against medical advise  Tobacco Abuse  -Nicotine patch ordered -Smoking Cessation Counseling given that he has no intention of quitting  Metabolic Acidosis -Mild -CO2 was 21, Chloride Level was 103, and AG was 13; now improved with a CO2 of 29, anion gap of 10, chloride level of 102 -Continue to Monitor and Trend -Repeat CBC in the AM  GOC -Patient is a DO NOT RESUSCITATE  Discharge Instructions  Discharge Instructions    Amb Referral to HF Clinic   Complete by: As directed      Allergies as of 01/23/2020   No Known Allergies     Medication List    TAKE these medications   acetaminophen-codeine 300-30 MG tablet Commonly known as: TYLENOL #3 Take 1 tablet by mouth 4 (four) times daily as needed for moderate pain.  albuterol 108 (90 Base) MCG/ACT inhaler Commonly known as: VENTOLIN HFA Inhale 1 puff into the lungs every 6 (six) hours as needed for wheezing or shortness of breath.   amLODipine 10 MG tablet Commonly known as: NORVASC Take 10 mg by mouth daily.   budesonide-formoterol 160-4.5 MCG/ACT inhaler Commonly known as: SYMBICORT Inhale 2 puffs into the lungs 2 (two) times daily.   carvedilol 12.5 MG tablet Commonly known as: COREG Take 12.5 mg by mouth 2 (two) times daily with a meal.   diclofenac Sodium 1 % Gel Commonly known as: VOLTAREN Apply 2 g topically 4 (four) times daily as needed (pain).    gabapentin 400 MG capsule Commonly known as: NEURONTIN Take 800 mg by mouth every 8 (eight) hours.   methocarbamol 750 MG tablet Commonly known as: ROBAXIN Take 750 mg by mouth every 8 (eight) hours as needed for muscle spasms.   mirtazapine 30 MG tablet Commonly known as: REMERON Take 30 mg by mouth at bedtime.   simvastatin 40 MG tablet Commonly known as: ZOCOR Take 20 mg by mouth daily.   traZODone 100 MG tablet Commonly known as: DESYREL Take 200 mg by mouth at bedtime.   triprolidine-pseudoephedrine 2.5-60 MG Tabs tablet Commonly known as: APRODINE Take 1 tablet by mouth every 6 (six) hours as needed for allergies.       Follow-up Information    [facility] Follow up on 01/30/2020.   Specialty: Cardiology Why: at 9:30am. Enter through the [facility] Mall entrance Contact information: [address] [place] [id number] [phone]             No Known Allergies  Consultations:  Cardiology  Procedures/Studies: DG Chest 1 View  Result Date: 01/23/2020 CLINICAL DATA:  Shortness of breath EXAM: CHEST  1 VIEW COMPARISON:  01/21/2020 FINDINGS: Cardiac shadow is stable. Aortic calcifications are again seen. Diffuse vascular congestion with interstitial edema is again identified stable from the prior study. No new focal infiltrate is seen. No bony abnormality is noted. IMPRESSION: Stable vascular congestion and interstitial edema. Electronically Signed   By: Alcide CleverMark  Lukens M.D.   On: 01/23/2020 03:50   CT Angio Chest PE W and/or Wo Contrast  Result Date: 01/21/2020 CLINICAL DATA:  Shortness of breath.  AFib with RVR. EXAM: CT ANGIOGRAPHY CHEST WITH CONTRAST TECHNIQUE: Multidetector CT imaging of the chest was performed using the standard protocol during bolus administration of intravenous contrast. Multiplanar CT image reconstructions and MIPs were obtained to evaluate the vascular anatomy. CONTRAST:   75mL OMNIPAQUE IOHEXOL 350 MG/ML SOLN COMPARISON:  June 20, 2016. FINDINGS: Cardiovascular: Satisfactory opacification of the pulmonary arteries to the segmental level. No evidence of pulmonary embolism. Limited evaluation secondary to respiratory motion. Mild cardiomegaly. No pericardial effusion. Three-vessel coronary artery atherosclerotic calcifications. Aortic valve calcifications. Moderate to severe atherosclerotic calcifications throughout the aorta. Mediastinum/Nodes: No suspicious mediastinal lymph nodes. No suspicious axillary lymph nodes. Thyroid is unremarkable. Lungs/Pleura: Centrilobular emphysema. There is diffuse interlobular septal thickening. There is bronchial wall thickening. Small bilateral pleural effusions. There is more confluent bibasilar consolidative and ground-glass opacities in the dependent lungs. Unchanged RIGHT apical greater than LEFT apical scarring. Scattered debris within the trachea and bronchi. Evaluation for fine parenchymal detail is limited secondary to extensive respiratory motion. Upper Abdomen: No acute abnormality. Musculoskeletal: No chest wall abnormality. No acute or significant osseous findings. Unchanged sclerotic lesion of the LEFT posterior seventh rib, likely a bone island. Review of the MIP images  confirms the above findings. IMPRESSION: 1. No evidence of acute pulmonary embolism. 2. Small bilateral pleural effusions with diffuse interlobular septal thickening and bronchial wall thickening, consistent with pulmonary edema. More confluent bibasilar consolidative and ground-glass opacities may reflect aspiration or infection. Aortic Atherosclerosis (ICD10-I70.0) and Emphysema (ICD10-J43.9). Electronically Signed   By: Meda Klinefelter MD   On: 01/21/2020 15:56   DG Chest Portable 1 View  Result Date: 01/21/2020 CLINICAL DATA:  Shortness of breath. EXAM: PORTABLE CHEST 1 VIEW COMPARISON:  June 20, 2016. FINDINGS: Stable cardiomediastinal silhouette.  No pneumothorax is noted. Diffuse interstitial densities are noted throughout both lungs concerning for pulmonary edema. Minimal pleural effusions may be present. Bony thorax is unremarkable. IMPRESSION: Findings consistent with bilateral pulmonary edema. Electronically Signed   By: Lupita Raider M.D.   On: 01/21/2020 14:16   ECHOCARDIOGRAM COMPLETE  Result Date: 01/22/2020    ECHOCARDIOGRAM REPORT   Patient Name:   Ross Fisher Date of Exam: 01/22/2020 Medical Rec #:  [id number]        Height:       68.0 in Accession #:    [id number]       Weight:       132.3 lb Date of Birth:  May 16, 1945        BSA:          1.714 m Patient Age:    74 years         BP:           105/74 mmHg Patient Gender: M                HR:           73 bpm. Exam Location:  [facility] Procedure: 2D Echo, Cardiac Doppler and Color Doppler Indications:     CHF- acute diastolic 428.31  History:         Patient has no prior history of Echocardiogram examinations.                  COPD; Risk Factors:Hypertension.  Sonographer:     Cristela Blue RDCS (AE) Referring Phys:  [id number] Alford Fisher Diagnosing Phys: Alwyn Pea MD IMPRESSIONS  1. Left ventricular ejection fraction, by estimation, is 50 to 55%. The left ventricle has low normal function. The left ventricle has no regional wall motion abnormalities. Left ventricular diastolic parameters were normal.  2. Right ventricular systolic function is normal. The right ventricular size is normal.  3. The mitral valve is normal in structure. Trivial mitral valve regurgitation.  4. The aortic valve is normal in structure. Aortic valve regurgitation is not visualized. FINDINGS  Left Ventricle: Left ventricular ejection fraction, by estimation, is 50 to 55%. The left ventricle has low normal function. The left ventricle has no regional wall motion abnormalities. The left ventricular internal cavity size was normal in size. There is no left ventricular hypertrophy. Left ventricular diastolic  parameters were normal. Right Ventricle: The right ventricular size is normal. No increase in right ventricular wall thickness. Right ventricular systolic function is normal. Left Atrium: Left atrial size was normal in size. Right Atrium: Right atrial size was normal in size. Pericardium: There is no evidence of pericardial effusion. Mitral Valve: The mitral valve is normal in structure. Trivial mitral valve regurgitation. Tricuspid Valve: The tricuspid valve is normal in structure. Tricuspid valve regurgitation is trivial. Aortic Valve: The aortic valve is normal in structure. Aortic valve regurgitation is not visualized. Aortic valve mean gradient measures 2.0 mmHg.  Aortic valve peak gradient measures 3.5 mmHg. Aortic valve area, by VTI measures 2.23 cm. Pulmonic Valve: The pulmonic valve was grossly normal. Pulmonic valve regurgitation is not visualized. Aorta: Aortic root could not be assessed. IAS/Shunts: No atrial level shunt detected by color flow Doppler.  LEFT VENTRICLE PLAX 2D LVIDd:         4.08 cm LVIDs:         3.06 cm LV PW:         1.16 cm LV IVS:        0.89 cm LVOT diam:     2.00 cm LV SV:         34 LV SV Index:   20 LVOT Area:     3.14 cm  RIGHT VENTRICLE RV Basal diam:  3.60 cm RV S prime:     17.10 cm/s TAPSE (M-mode): 3.6 cm LEFT ATRIUM             Index       RIGHT ATRIUM           Index LA diam:        3.70 cm 2.16 cm/m  RA Area:     23.20 cm LA Vol (A2C):   70.3 ml 41.01 ml/m RA Volume:   71.90 ml  41.94 ml/m LA Vol (A4C):   61.7 ml 35.99 ml/m LA Biplane Vol: 68.5 ml 39.96 ml/m  AORTIC VALVE                   PULMONIC VALVE AV Area (Vmax):    2.25 cm    PV Vmax:        0.68 m/s AV Area (Vmean):   2.11 cm    PV Peak grad:   1.8 mmHg AV Area (VTI):     2.23 cm    RVOT Peak grad: 2 mmHg AV Vmax:           93.95 cm/s AV Vmean:          60.550 cm/s AV VTI:            0.152 m AV Peak Grad:      3.5 mmHg AV Mean Grad:      2.0 mmHg LVOT Vmax:         67.20 cm/s LVOT Vmean:        40.600  cm/s LVOT VTI:          0.108 m LVOT/AV VTI ratio: 0.71  AORTA Ao Root diam: 3.30 cm MITRAL VALVE                TRICUSPID VALVE MV Area (PHT): 5.23 cm     TR Peak grad:   31.6 mmHg MV Decel Time: 145 msec     TR Vmax:        281.00 cm/s MV E velocity: 100.00 cm/s                             SHUNTS                             Systemic VTI:  0.11 m                             Systemic Diam: 2.00 cm Dwayne Salome Arnt MD Electronically signed by Alwyn Pea MD Signature Date/Time: 01/22/2020/5:36:52 PM  Final    ECHOCARDIOGRAM IMPRESSIONS    1. Left ventricular ejection fraction, by estimation, is 50 to 55%. The  left ventricle has low normal function. The left ventricle has no regional  wall motion abnormalities. Left ventricular diastolic parameters were  normal.  2. Right ventricular systolic function is normal. The right ventricular  size is normal.  3. The mitral valve is normal in structure. Trivial mitral valve  regurgitation.  4. The aortic valve is normal in structure. Aortic valve regurgitation is  not visualized.   FINDINGS  Left Ventricle: Left ventricular ejection fraction, by estimation, is 50  to 55%. The left ventricle has low normal function. The left ventricle has  no regional wall motion abnormalities. The left ventricular internal  cavity size was normal in size.  There is no left ventricular hypertrophy. Left ventricular diastolic  parameters were normal.   Right Ventricle: The right ventricular size is normal. No increase in  right ventricular wall thickness. Right ventricular systolic function is  normal.   Left Atrium: Left atrial size was normal in size.   Right Atrium: Right atrial size was normal in size.   Pericardium: There is no evidence of pericardial effusion.   Mitral Valve: The mitral valve is normal in structure. Trivial mitral  valve regurgitation.   Tricuspid Valve: The tricuspid valve is normal in structure. Tricuspid  valve  regurgitation is trivial.   Aortic Valve: The aortic valve is normal in structure. Aortic valve  regurgitation is not visualized. Aortic valve mean gradient measures 2.0  mmHg. Aortic valve peak gradient measures 3.5 mmHg. Aortic valve area, by  VTI measures 2.23 cm.   Pulmonic Valve: The pulmonic valve was grossly normal. Pulmonic valve  regurgitation is not visualized.   Aorta: Aortic root could not be assessed.   IAS/Shunts: No atrial level shunt detected by color flow Doppler.     LEFT VENTRICLE  PLAX 2D  LVIDd:     4.08 cm  LVIDs:     3.06 cm  LV PW:     1.16 cm  LV IVS:    0.89 cm  LVOT diam:   2.00 cm  LV SV:     34  LV SV Index:  20  LVOT Area:   3.14 cm     RIGHT VENTRICLE  RV Basal diam: 3.60 cm  RV S prime:   17.10 cm/s  TAPSE (M-mode): 3.6 cm   LEFT ATRIUM       Index    RIGHT ATRIUM      Index  LA diam:    3.70 cm 2.16 cm/m RA Area:   23.20 cm  LA Vol (A2C):  70.3 ml 41.01 ml/m RA Volume:  71.90 ml 41.94 ml/m  LA Vol (A4C):  61.7 ml 35.99 ml/m  LA Biplane Vol: 68.5 ml 39.96 ml/m  AORTIC VALVE          PULMONIC VALVE  AV Area (Vmax):  2.25 cm  PV Vmax:    0.68 m/s  AV Area (Vmean):  2.11 cm  PV Peak grad:  1.8 mmHg  AV Area (VTI):   2.23 cm  RVOT Peak grad: 2 mmHg  AV Vmax:      93.95 cm/s  AV Vmean:     60.550 cm/s  AV VTI:      0.152 m  AV Peak Grad:   3.5 mmHg  AV Mean Grad:   2.0 mmHg  LVOT Vmax:     67.20 cm/s  LVOT Vmean:  40.600 cm/s  LVOT VTI:     0.108 m  LVOT/AV VTI ratio: 0.71    AORTA  Ao Root diam: 3.30 cm   MITRAL VALVE        TRICUSPID VALVE  MV Area (PHT): 5.23 cm   TR Peak grad:  31.6 mmHg  MV Decel Time: 145 msec   TR Vmax:    281.00 cm/s  MV E velocity: 100.00 cm/s               SHUNTS               Systemic VTI: 0.11 m                Systemic Diam: 2.00 cm   Subjective: Examined his morning and patient was feeling better.  Cardiology wanted to watch him continue diuresis him however he did not want to stay hospitalized and signed out AGAINST MEDICAL ADVICE later this afternoon.  He is of sound mind when he made the decision to leave AGAINST MEDICAL ADVICE and understands risks of worsening, decompensation and even possible death and understands and accept these risks and still has left AGAINST MEDICAL ADVICE and nursing has told me that he signed the papers and called his right already and asked for his IVs to be removed.  Discharge Exam: Vitals:   01/23/20 1521 01/23/20 1537  BP: (!) 136/92   Pulse: (!) 102   Resp: 18   Temp: 97.7 F (36.5 C)   SpO2: 97% 94%   Vitals:   01/23/20 0830 01/23/20 1110 01/23/20 1521 01/23/20 1537  BP: 128/80 (!) 152/70 (!) 136/92   Pulse: 69 91 (!) 102   Resp: 18 18 18    Temp: 98.1 F (36.7 C) 98.2 F (36.8 C) 97.7 F (36.5 C)   TempSrc: Oral Oral    SpO2: 97% 98% 97% 94%  Weight:      Height:       General: Pt is alert, awake, not in acute distress Cardiovascular: Irregularly irregular, S1/S2 +, no rubs, no gallops Respiratory: Diminished bilaterally with coarse breath sounds, no wheezing, no rhonchi; unlabored breathing today Abdominal: Soft, NT, ND, bowel sounds + Extremities: Trace edema, no cyanosis  The results of significant diagnostics from this hospitalization (including imaging, microbiology, ancillary and laboratory) are listed below for reference.    Microbiology: Recent Results (from the past 240 hour(s))  SARS Coronavirus 2 by RT PCR (hospital order, performed in [facility] hospital lab) Nasopharyngeal Nasopharyngeal Swab     Status: None   Collection Time: 01/21/20  1:58 PM   Specimen: Nasopharyngeal Swab  Result Value Ref Range Status   SARS Coronavirus 2 NEGATIVE NEGATIVE Final    Comment: (NOTE) SARS-CoV-2 target nucleic acids are NOT DETECTED.  The  SARS-CoV-2 RNA is generally detectable in upper and lower respiratory specimens during the acute phase of infection. The lowest concentration of SARS-CoV-2 viral copies this assay can detect is 250 copies / mL. A negative result does not preclude SARS-CoV-2 infection and should not be used as the sole basis for treatment or other patient management decisions.  A negative result may occur with improper specimen collection / handling, submission of specimen other than nasopharyngeal swab, presence of viral mutation(s) within the areas targeted by this assay, and inadequate number of viral copies (<250 copies / mL). A negative result must be combined with clinical observations, patient history, and epidemiological information.  Fact Sheet for Patients:   BoilerBrush.com.cy  Fact Sheet for Healthcare  Providers: https://pope.com/  This test is not yet approved or  cleared by the [place] and has been authorized for detection and/or diagnosis of SARS-CoV-2 by FDA under an Emergency Use Authorization (EUA).  This EUA will remain in effect (meaning this test can be used) for the duration of the COVID-19 declaration under Section 564(b)(1) of the Act, 21 U.S.C. section 360bbb-3(b)(1), unless the authorization is terminated or revoked sooner.  Performed at [facility], [address] [place], [place] [address]   Blood culture (routine x 2)     Status: None (Preliminary result)   Collection Time: 01/21/20  5:32 PM   Specimen: BLOOD  Result Value Ref Range Status   Specimen Description BLOOD BLOOD RIGHT FOREARM  Final   Special Requests   Final    BOTTLES DRAWN AEROBIC AND ANAEROBIC Blood Culture adequate volume   Culture   Final    NO GROWTH 2 DAYS Performed at [facility], [address] [place], [place] [address]    Report Status PENDING  Incomplete  Blood culture (routine x 2)     Status: None  (Preliminary result)   Collection Time: 01/21/20  5:32 PM   Specimen: BLOOD  Result Value Ref Range Status   Specimen Description BLOOD BLOOD LEFT FOREARM  Final   Special Requests   Final    BOTTLES DRAWN AEROBIC AND ANAEROBIC Blood Culture adequate volume   Culture   Final    NO GROWTH 2 DAYS Performed at [facility], [address] [place], [place] [address]    Report Status PENDING  Incomplete     Labs: BNP (last 3 results) Recent Labs    01/21/20 1358  BNP 1,455.0*   Basic Metabolic Panel: Recent Labs  Lab 01/21/20 1358 01/22/20 0435 01/23/20 0358  NA 139 137 141  K 4.6 4.1 3.6  CL 105 103 102  CO2 21* 21* 29  GLUCOSE 125* 131* 125*  BUN 15 21 30*  CREATININE 1.15 1.06 1.21  CALCIUM 8.7* 8.2* 8.3*  MG  --   --  2.2  PHOS  --   --  3.0   Liver Function Tests: Recent Labs  Lab 01/21/20 1358 01/23/20 0358  AST 59* 41  ALT 66* 49*  ALKPHOS 75 55  BILITOT 1.5* 0.8  PROT 7.3 6.6  ALBUMIN 4.1 3.4*   No results for input(s): LIPASE, AMYLASE in the last 168 hours. No results for input(s): AMMONIA in the last 168 hours. CBC: Recent Labs  Lab 01/21/20 1358 01/22/20 0435 01/23/20 0358  WBC 9.9 8.1 13.2*  NEUTROABS 8.1*  --  12.2*  HGB 13.9 12.9* 13.2  HCT 40.1 35.8* 39.1  MCV 94.4 92.0 95.8  PLT 269 230 258   Cardiac Enzymes: No results for input(s): CKTOTAL, CKMB, CKMBINDEX, TROPONINI in the last 168 hours. BNP: Invalid input(s): POCBNP CBG: No results for input(s): GLUCAP in the last 168 hours. D-Dimer No results for input(s): DDIMER in the last 72 hours. Hgb A1c No results for input(s): HGBA1C in the last 72 hours. Lipid Profile No results for input(s): CHOL, HDL, LDLCALC, TRIG, CHOLHDL, LDLDIRECT in the last 72 hours. Thyroid function studies Recent Labs    01/22/20 0435  TSH 0.444   Anemia work up No results for input(s): VITAMINB12, FOLATE, FERRITIN, TIBC, IRON, RETICCTPCT in the last 72 hours. Urinalysis    Component  Value Date/Time   COLORURINE STRAW (A) 06/20/2016 1753   APPEARANCEUR CLEAR (A) 06/20/2016 1753  LABSPEC 1.008 06/20/2016 1753   PHURINE 7.0 06/20/2016 1753   GLUCOSEU NEGATIVE 06/20/2016 1753   HGBUR NEGATIVE 06/20/2016 1753   BILIRUBINUR NEGATIVE 06/20/2016 1753   KETONESUR NEGATIVE 06/20/2016 1753   PROTEINUR NEGATIVE 06/20/2016 1753   NITRITE NEGATIVE 06/20/2016 1753   LEUKOCYTESUR NEGATIVE 06/20/2016 1753   Sepsis Labs Invalid input(s): PROCALCITONIN,  WBC,  LACTICIDVEN Microbiology Recent Results (from the past 240 hour(s))  SARS Coronavirus 2 by RT PCR (hospital order, performed in [facility] hospital lab) Nasopharyngeal Nasopharyngeal Swab     Status: None   Collection Time: 01/21/20  1:58 PM   Specimen: Nasopharyngeal Swab  Result Value Ref Range Status   SARS Coronavirus 2 NEGATIVE NEGATIVE Final    Comment: (NOTE) SARS-CoV-2 target nucleic acids are NOT DETECTED.  The SARS-CoV-2 RNA is generally detectable in upper and lower respiratory specimens during the acute phase of infection. The lowest concentration of SARS-CoV-2 viral copies this assay can detect is 250 copies / mL. A negative result does not preclude SARS-CoV-2 infection and should not be used as the sole basis for treatment or other patient management decisions.  A negative result may occur with improper specimen collection / handling, submission of specimen other than nasopharyngeal swab, presence of viral mutation(s) within the areas targeted by this assay, and inadequate number of viral copies (<250 copies / mL). A negative result must be combined with clinical observations, patient history, and epidemiological information.  Fact Sheet for Patients:   BoilerBrush.com.cy  Fact Sheet for Healthcare Providers: https://pope.com/  This test is not yet approved or  cleared by the [place] FDA and has been authorized for detection and/or diagnosis of  SARS-CoV-2 by FDA under an Emergency Use Authorization (EUA).  This EUA will remain in effect (meaning this test can be used) for the duration of the COVID-19 declaration under Section 564(b)(1) of the Act, 21 U.S.C. section 360bbb-3(b)(1), unless the authorization is terminated or revoked sooner.  Performed at [facility], [address] [place], [place] [address]   Blood culture (routine x 2)     Status: None (Preliminary result)   Collection Time: 01/21/20  5:32 PM   Specimen: BLOOD  Result Value Ref Range Status   Specimen Description BLOOD BLOOD RIGHT FOREARM  Final   Special Requests   Final    BOTTLES DRAWN AEROBIC AND ANAEROBIC Blood Culture adequate volume   Culture   Final    NO GROWTH 2 DAYS Performed at [facility], [address], [place], [place] [address]    Report Status PENDING  Incomplete  Blood culture (routine x 2)     Status: None (Preliminary result)   Collection Time: 01/21/20  5:32 PM   Specimen: BLOOD  Result Value Ref Range Status   Specimen Description BLOOD BLOOD LEFT FOREARM  Final   Special Requests   Final    BOTTLES DRAWN AEROBIC AND ANAEROBIC Blood Culture adequate volume   Culture   Final    NO GROWTH 2 DAYS Performed at [facility], [address], [place], [place] [address]    Report Status PENDING  Incomplete   Time coordinating discharge: 35 minutes  SIGNED:  Merlene Laughter, DO [facility] 01/23/2020, 5:34 PM Pager is on AMION  If 7PM-7AM, please contact night-coverage [email or web address]

## 2020-01-26 LAB — CULTURE, BLOOD (ROUTINE X 2)
Culture: NO GROWTH
Culture: NO GROWTH
Special Requests: ADEQUATE
Special Requests: ADEQUATE

## 2020-01-29 NOTE — Progress Notes (Deleted)
   Patient ID: SON BARKAN, male    DOB: 1945/06/01, 74 y.o.   MRN: [id number]  HPI  Mr Fawcett is a 74 y/o male with a history of  Echo report from 01/22/20 reviewed and showed an EF of 50-55% with trivial MR but without structural changes.   Admitted 01/21/20 due to HF exacerbation along with atrial fibrillation. Cardiology consult obtained. Chest CT negative for PE. Initially needed bipap. Placed on cardizem drip and transitioned to oral medication. Initially placed on IV lasix. Left AMA after 2 days.   He presents today for his initial visit with a chief complaint of   Review of Systems    Physical Exam    Assessment & Plan:  1: Chronic heart failure with preserved ejection fraction without structural changes- - NYHA class - BNP 01/21/20 showed 1455.0  2: HTN- - BP - BMP 01/23/20 reviewed and showed sodium 141, potassium 3.6, creatinine 1.21 and GFR 59  3: Atrial fibrillation-

## 2020-01-30 ENCOUNTER — Ambulatory Visit: Payer: No Typology Code available for payment source | Admitting: Family

## 2020-01-30 ENCOUNTER — Telehealth: Payer: Self-pay | Admitting: Family

## 2020-01-30 NOTE — Telephone Encounter (Signed)
Patient did not show for his [facility] appointment on 01/30/20. Will attempt to reschedule.

## 2020-04-07 ENCOUNTER — Ambulatory Visit: Payer: No Typology Code available for payment source | Admitting: Internal Medicine

## 2020-08-27 ENCOUNTER — Encounter: Payer: Self-pay | Admitting: Emergency Medicine

## 2020-08-27 ENCOUNTER — Emergency Department: Payer: No Typology Code available for payment source

## 2020-08-27 ENCOUNTER — Other Ambulatory Visit: Payer: Self-pay

## 2020-08-27 ENCOUNTER — Emergency Department
Admission: EM | Admit: 2020-08-27 | Discharge: 2020-08-27 | Disposition: A | Discharge status: Home / Self Care | Payer: No Typology Code available for payment source | Attending: Emergency Medicine | Admitting: Emergency Medicine

## 2020-08-27 DIAGNOSIS — M5441 Lumbago with sciatica, right side: Secondary | ICD-10-CM | POA: Diagnosis not present

## 2020-08-27 DIAGNOSIS — F17210 Nicotine dependence, cigarettes, uncomplicated: Secondary | ICD-10-CM | POA: Insufficient documentation

## 2020-08-27 DIAGNOSIS — M5412 Radiculopathy, cervical region: Secondary | ICD-10-CM

## 2020-08-27 DIAGNOSIS — M545 Low back pain, unspecified: Secondary | ICD-10-CM | POA: Diagnosis present

## 2020-08-27 DIAGNOSIS — Z7951 Long term (current) use of inhaled steroids: Secondary | ICD-10-CM | POA: Insufficient documentation

## 2020-08-27 DIAGNOSIS — I10 Essential (primary) hypertension: Secondary | ICD-10-CM | POA: Diagnosis not present

## 2020-08-27 DIAGNOSIS — M5030 Other cervical disc degeneration, unspecified cervical region: Secondary | ICD-10-CM

## 2020-08-27 DIAGNOSIS — J441 Chronic obstructive pulmonary disease with (acute) exacerbation: Secondary | ICD-10-CM | POA: Insufficient documentation

## 2020-08-27 DIAGNOSIS — R200 Anesthesia of skin: Secondary | ICD-10-CM | POA: Insufficient documentation

## 2020-08-27 DIAGNOSIS — G8929 Other chronic pain: Secondary | ICD-10-CM

## 2020-08-27 DIAGNOSIS — Z79899 Other long term (current) drug therapy: Secondary | ICD-10-CM | POA: Insufficient documentation

## 2020-08-27 DIAGNOSIS — M5010 Cervical disc disorder with radiculopathy, unspecified cervical region: Secondary | ICD-10-CM | POA: Insufficient documentation

## 2020-08-27 MED ORDER — ACETAMINOPHEN-CODEINE #3 300-30 MG PO TABS: 1.0000 | ORAL_TABLET | Freq: Four times a day (QID) | ORAL | 0 refills | Status: DC | PRN

## 2020-08-27 MED ORDER — PREDNISONE 10 MG PO TABS: ORAL_TABLET | ORAL | 0 refills | Status: DC

## 2020-08-27 MED ORDER — DEXAMETHASONE SODIUM PHOSPHATE 10 MG/ML IJ SOLN
10.0000 mg | Freq: Once | INTRAMUSCULAR | Status: AC
  Administered 2020-08-27: 10 mg via INTRAMUSCULAR
  Filled 2020-08-27: qty 1

## 2020-08-27 MED ORDER — METHOCARBAMOL 500 MG PO TABS: 500.0000 mg | ORAL_TABLET | Freq: Three times a day (TID) | ORAL | 0 refills | Status: DC | PRN

## 2020-08-27 NOTE — Discharge Instructions (Addendum)
Follow-up with your primary care provider if any continued problems or concerns.  A prescription for your pain medication and methocarbamol was sent to the pharmacy.  Prednisone 3 tablets once a day for the next 4 days should help with the numbness that you are experiencing both in your leg and arm.  Do not drive or operate machinery while taking these medications as it could cause drowsiness and increase your risk for falling or injury.  You may use ice or heat to your back and neck as needed for discomfort.  Turn to the emergency department if any severe worsening of your symptoms.

## 2020-08-27 NOTE — ED Triage Notes (Addendum)
Presents with lower back pain hx of bulging disc  States pain became more sever about 5 days ago  Having numbness to right upper leg and also numbness to left 4th and 5th fingers  Denies any bowel or bladder control

## 2020-08-27 NOTE — ED Provider Notes (Signed)
[facility] Emergency Department Provider Note  ____________________________________________   Event Date/Time   First MD Initiated Contact with Patient 08/27/20 1132     (approximate)  I have reviewed the triage vital signs and the nursing notes.   HISTORY  Chief Complaint Back Pain   HPI Ross Fisher is a 75 y.o. male presents to the ED with complaint of low back pain with history of bulging disc.  Patient states that 5 days ago became more severe after he mowed on his tractor which was going over extremely rough ground and he was constantly being "bounced around".  Patient also complains of some right upper leg radiculopathy along with left fourth and fifth finger numbness.  Patient denies any incontinence of bowel or bladder.  He reports he is currently not taking any medication for his back or neck.  Patient also reports that he has chronic problems with his neck.  [facility] for all his medical care.         Past Medical History:  Diagnosis Date  . Back pain   . COPD (chronic obstructive pulmonary disease) (HCC)   . High cholesterol   . Hypercholesteremia   . Hypertension     Patient Active Problem List   Diagnosis Date Noted  . Acute hypoxemic respiratory failure (HCC) 01/21/2020  . Acute congestive heart failure (HCC)   . Atrial fibrillation with RVR (HCC)   . Elevated d-dimer   . COPD with acute exacerbation (HCC)   . Essential hypertension   . Hyperlipidemia   . Tobacco abuse     Past Surgical History:  Procedure Laterality Date  . CATARACT EXTRACTION    . ROTATOR CUFF REPAIR     bilat    Prior to Admission medications   Medication Sig Start Date End Date Taking? Authorizing Provider  acetaminophen-codeine (TYLENOL #3) 300-30 MG tablet Take 1 tablet by mouth every 6 (six) hours as needed for moderate pain. 08/27/20  Yes Tommi RumpsSummers, Tabias Swayze L, PA-C  methocarbamol (ROBAXIN) 500 MG tablet Take 1 tablet (500 mg  total) by mouth every 8 (eight) hours as needed for muscle spasms. 08/27/20  Yes Tommi RumpsSummers, Dakai Braithwaite L, PA-C  predniSONE (DELTASONE) 10 MG tablet Take 3 tablets once a day for 4 days 08/27/20  Yes Bridget HartshornSummers, Shaily Librizzi L, PA-C  albuterol (PROVENTIL HFA;VENTOLIN HFA) 108 (90 BASE) MCG/ACT inhaler Inhale 1 puff into the lungs every 6 (six) hours as needed for wheezing or shortness of breath.     [provider]  amLODipine (NORVASC) 10 MG tablet Take 10 mg by mouth daily.    [provider]  budesonide-formoterol (SYMBICORT) 160-4.5 MCG/ACT inhaler Inhale 2 puffs into the lungs 2 (two) times daily.    [provider]  carvedilol (COREG) 12.5 MG tablet Take 12.5 mg by mouth 2 (two) times daily with a meal.    [provider]  diclofenac Sodium (VOLTAREN) 1 % GEL Apply 2 g topically 4 (four) times daily as needed (pain).    [provider]  gabapentin (NEURONTIN) 400 MG capsule Take 800 mg by mouth every 8 (eight) hours.     [provider]  mirtazapine (REMERON) 30 MG tablet Take 30 mg by mouth at bedtime.    [provider]  simvastatin (ZOCOR) 40 MG tablet Take 20 mg by mouth daily.     [provider]  traZODone (DESYREL) 100 MG tablet Take 200 mg by mouth at bedtime.  [provider]  triprolidine-pseudoephedrine (APRODINE) 2.5-60 MG TABS tablet Take 1 tablet by mouth every 6 (six) hours as needed for allergies.    [provider]    Allergies Patient has no known allergies.  Family History  Problem Relation Age of Onset  . CAD Mother   . Alzheimer's disease Father     Social History Social History   Tobacco Use  . Smoking status: Current Every Day Smoker    Types: Cigarettes  . Smokeless tobacco: Never Used  Substance Use Topics  . Alcohol use: No  . Drug use: No    Review of Systems Constitutional: No fever/chills.  Patient very hard of hearing. Eyes: No visual changes. ENT: No  complaint. Cardiovascular: Denies chest pain. Respiratory: Denies shortness of breath. Gastrointestinal: No abdominal pain.  No nausea, no vomiting.  No diarrhea.  No constipation. Genitourinary: Negative for dysuria. Musculoskeletal: Positive for cervical and low back pain both chronic and acute. Skin: Negative for rash. Neurological: Negative for headaches, focal weakness.  Patient complains of right lower extremity radiculopathy to his knee.  Also left cervical radiculopathy involving the fourth and fifth fingers.   ____________________________________________   PHYSICAL EXAM:  VITAL SIGNS: ED Triage Vitals  Enc Vitals Group     BP 08/27/20 1116 (!) 126/94     Pulse Rate 08/27/20 1116 86     Resp 08/27/20 1116 18     Temp 08/27/20 1116 98 F (36.7 C)     Temp Source 08/27/20 1116 Oral     SpO2 08/27/20 1116 100 %     Weight 08/27/20 1117 123 lb 7.3 oz (56 kg)     Height 08/27/20 1117 5\' 8"  (1.727 m)     Head Circumference --      Peak Flow --      Pain Score 08/27/20 1116 9     Pain Loc --      Pain Edu? --      Excl. in GC? --     Constitutional: Alert and oriented. Well appearing and in no acute distress. Eyes: Conjunctivae are normal.  Head: Atraumatic. Neck: No stridor.  There is diffuse tenderness on palpation of cervical spine posteriorly but no step-offs are appreciated.  No skin discoloration or abrasions are noted.  There is some tenderness on palpation of the left trapezius muscle and cervical muscles. Cardiovascular: Normal rate, regular rhythm. Grossly normal heart sounds.  Good peripheral circulation. Respiratory: Normal respiratory effort.  No retractions. Lungs CTAB. Gastrointestinal: Soft and nontender. No distention. No abdominal bruits. No CVA tenderness. Musculoskeletal: No tenderness is noted on palpation of the thoracic spine however upper lumbar and lower lumbar is moderately tender to touch.  No step-offs are appreciated.  Patient has limited range  of motion secondary to pain.  Straight leg raises were approximately 40 degrees bilaterally with discomfort in his lower back.  Muscle strength 5/5 bilaterally lower extremities. Neurologic:  Normal speech and language.  Reflexes 1+ bilaterally lower extremity.  No gross focal neurologic deficits are appreciated. No gait instability. Skin:  Skin is warm, dry and intact.  No rash or ecchymosis is noted. Psychiatric: Mood and affect are normal. Speech and behavior are normal.  ____________________________________________   LABS (all labs ordered are listed, but only abnormal results are displayed)  Labs Reviewed - No data to display ____________________________________________   RADIOLOGY I, 08/29/20, personally viewed and evaluated these images (plain radiographs) as part of my medical decision making, as well as reviewing  the written report by the radiologist.   Official radiology report(s): DG Cervical Spine 2-3 Views  Result Date: 08/27/2020 CLINICAL DATA:  Neck and left arm pain. EXAM: CERVICAL SPINE - 2-3 VIEW COMPARISON:  None. FINDINGS: Straightening of the normal lordosis. Disc space narrowing at C4-5, C5-6 and C6-7. Mild facet osteoarthritis. This could be symptomatic. Carotid calcification is noted.  Surgical clips in the left neck. IMPRESSION: 1. Degenerative disc disease and degenerative facet disease in the mid and lower cervical region. This could be symptomatic. 2. Carotid calcification incidentally noted. Electronically Signed   By: Paulina Fusi M.D.   On: 08/27/2020 12:51   DG Lumbar Spine 2-3 Views  Result Date: 08/27/2020 CLINICAL DATA:  Back pain and right radicular pain. EXAM: LUMBAR SPINE - 2-3 VIEW COMPARISON:  CT abdomen 11/29/2016 FINDINGS: Scoliotic curvature convex to the right. Straightening of the normal lumbar lordosis. Disc space narrowing most pronounced at L3-4 but also at L4-5. No evidence of fracture. Aortic atherosclerotic calcification is noted.  IMPRESSION: 1. 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 Degenerative disc disease most pronounced at L3-4 and L4-5. This could be symptomatic. 2. Aortic atherosclerosis. Electronically Signed   By: Paulina Fusi M.D.   On: 08/27/2020 12:50    ____________________________________________   PROCEDURES  Procedure(s) performed (including Critical Care):  Procedures   ____________________________________________   INITIAL IMPRESSION / ASSESSMENT AND PLAN / ED COURSE  As part of my medical decision making, I reviewed the following data within the electronic MEDICAL RECORD NUMBER Notes from prior ED visits and [place] Controlled Substance Database  75 year old male presents to the ED with complaint of low back pain and cervical pain with history of bulging disc.  He states that for the last 5 days after riding mowing his yard that he began having pain.  He complains of paresthesias into his right upper thigh and also left upper extremity down to his fourth and fifth fingers.  Patient drove himself to the emergency department and is unable to have someone pick him up.  An injection of Decadron was given to him while in the ED.  X-rays showed degenerative changes both in the mid to lower cervical spine and more prominent in the L3-L4 and L4-L5 region.  Patient was placed on prednisone 30 mg for the next 4 days along with Tylenol 3 which she has taken in the past without any difficulties.  Methocarbamol 1 every 8 hours as needed for muscle spasms.  He is to follow-up with his regular doctor at the [facility] hospital if any continued problems. ____________________________________________   FINAL CLINICAL IMPRESSION(S) / ED DIAGNOSES  Final diagnoses:  Chronic right-sided low back pain with right-sided sciatica  Degenerative disc disease, cervical  Left cervical radiculopathy     ED Discharge Orders         Ordered    predniSONE (DELTASONE) 10 MG tablet        08/27/20 1314    acetaminophen-codeine (TYLENOL  #3) 300-30 MG tablet  Every 6 hours PRN        08/27/20 1314    methocarbamol (ROBAXIN) 500 MG tablet  Every 8 hours PRN        08/27/20 1314          *Please note:  Ross Fisher was evaluated in Emergency Department on 08/27/2020 for the symptoms described in the history of present illness. He was evaluated in the context of the global COVID-19 pandemic, which necessitated consideration that the patient might be at risk  for infection with the SARS-CoV-2 virus that causes COVID-19. Institutional protocols and algorithms that pertain to the evaluation of patients at risk for COVID-19 are in a state of rapid change based on information released by regulatory bodies including the CDC and federal and state organizations. These policies and algorithms were followed during the patient's care in the ED.  Some ED evaluations and interventions may be delayed as a result of limited staffing during and the pandemic.*   Note:  This document was prepared using Dragon voice recognition software and may include unintentional dictation errors.    Tommi Rumps, PA-C 08/27/20 1546    Chesley Noon, MD 08/27/20 2028

## 2020-11-10 ENCOUNTER — Emergency Department
Admission: EM | Admit: 2020-11-10 | Discharge: 2020-11-10 | Disposition: A | Discharge status: Home / Self Care | Payer: No Typology Code available for payment source | Attending: Emergency Medicine | Admitting: Emergency Medicine

## 2020-11-10 ENCOUNTER — Other Ambulatory Visit: Payer: Self-pay

## 2020-11-10 DIAGNOSIS — F17210 Nicotine dependence, cigarettes, uncomplicated: Secondary | ICD-10-CM | POA: Diagnosis not present

## 2020-11-10 DIAGNOSIS — I509 Heart failure, unspecified: Secondary | ICD-10-CM | POA: Diagnosis not present

## 2020-11-10 DIAGNOSIS — H61012 Acute perichondritis of left external ear: Secondary | ICD-10-CM | POA: Diagnosis not present

## 2020-11-10 DIAGNOSIS — Z7951 Long term (current) use of inhaled steroids: Secondary | ICD-10-CM | POA: Diagnosis not present

## 2020-11-10 DIAGNOSIS — J449 Chronic obstructive pulmonary disease, unspecified: Secondary | ICD-10-CM | POA: Diagnosis not present

## 2020-11-10 DIAGNOSIS — Z79899 Other long term (current) drug therapy: Secondary | ICD-10-CM | POA: Insufficient documentation

## 2020-11-10 DIAGNOSIS — I110 Hypertensive heart disease with heart failure: Secondary | ICD-10-CM | POA: Insufficient documentation

## 2020-11-10 DIAGNOSIS — H9202 Otalgia, left ear: Secondary | ICD-10-CM | POA: Diagnosis present

## 2020-11-10 DIAGNOSIS — H61002 Unspecified perichondritis of left external ear: Secondary | ICD-10-CM

## 2020-11-10 MED ORDER — LEVOFLOXACIN 750 MG PO TABS
750.0000 mg | ORAL_TABLET | Freq: Once | ORAL | Status: AC
  Administered 2020-11-10: 750 mg via ORAL
  Filled 2020-11-10: qty 1

## 2020-11-10 MED ORDER — LEVOFLOXACIN 750 MG PO TABS: 750.0000 mg | ORAL_TABLET | Freq: Every day | ORAL | 0 refills | Status: AC

## 2020-11-10 NOTE — ED Provider Notes (Signed)
[facility] Emergency Department Provider Note  ____________________________________________   Event Date/Time   First MD Initiated Contact with Patient 11/10/20 [phone]     (approximate)  I have reviewed the triage vital signs and the nursing notes.   HISTORY  Chief Complaint Ear Pain  HPI Ross Fisher is a 75 y.o. male who presents to the emergency department for pain and swelling of the left ear for the last 3 months.  Patient denies history of similar.  He states he has chronic hearing loss, unchanged in the last 3 months.  He denies any associated headache, fever, chills.  He denies any known trauma at the onset of this.         Past Medical History:  Diagnosis Date   Back pain    COPD (chronic obstructive pulmonary disease) (HCC)    High cholesterol    Hypercholesteremia    Hypertension     Patient Active Problem List   Diagnosis Date Noted   Acute hypoxemic respiratory failure (HCC) 01/21/2020   Acute congestive heart failure (HCC)    Atrial fibrillation with RVR (HCC)    Elevated d-dimer    COPD with acute exacerbation (HCC)    Essential hypertension    Hyperlipidemia    Tobacco abuse     Past Surgical History:  Procedure Laterality Date   CATARACT EXTRACTION     ROTATOR CUFF REPAIR     bilat    Prior to Admission medications   Medication Sig Start Date End Date Taking? Authorizing Provider  levofloxacin (LEVAQUIN) 750 MG tablet Take 1 tablet (750 mg total) by mouth daily for 7 days. 11/10/20 11/17/20 Yes Cordaro Mukai, Ruben Gottron, PA  acetaminophen-codeine (TYLENOL #3) 300-30 MG tablet Take 1 tablet by mouth every 6 (six) hours as needed for moderate pain. 08/27/20   Tommi Rumps, PA-C  albuterol (PROVENTIL HFA;VENTOLIN HFA) 108 (90 BASE) MCG/ACT inhaler Inhale 1 puff into the lungs every 6 (six) hours as needed for wheezing or shortness of breath.     [provider]  amLODipine (NORVASC) 10 MG tablet Take 10 mg by mouth  daily.    [provider]  budesonide-formoterol (SYMBICORT) 160-4.5 MCG/ACT inhaler Inhale 2 puffs into the lungs 2 (two) times daily.    [provider]  carvedilol (COREG) 12.5 MG tablet Take 12.5 mg by mouth 2 (two) times daily with a meal.    [provider]  diclofenac Sodium (VOLTAREN) 1 % GEL Apply 2 g topically 4 (four) times daily as needed (pain).    [provider]  gabapentin (NEURONTIN) 400 MG capsule Take 800 mg by mouth every 8 (eight) hours.     [provider]  mirtazapine (REMERON) 30 MG tablet Take 30 mg by mouth at bedtime.    [provider]  predniSONE (DELTASONE) 10 MG tablet Take 3 tablets once a day for 4 days 08/27/20   Tommi Rumps, PA-C  simvastatin (ZOCOR) 40 MG tablet Take 20 mg by mouth daily.     [provider]  traZODone (DESYREL) 100 MG tablet Take 200 mg by mouth at bedtime.     [provider]  triprolidine-pseudoephedrine (APRODINE) 2.5-60 MG TABS tablet Take 1 tablet by mouth every 6 (six) hours as needed for allergies.    [provider]    Allergies Patient has no known allergies.  Family History  Problem Relation Age of Onset   CAD Mother    Alzheimer's disease Father  Social History Social History   Tobacco Use   Smoking status: Every Day    Pack years: 0.00    Types: Cigarettes   Smokeless tobacco: Never  Substance Use Topics   Alcohol use: No   Drug use: No    Review of Systems Constitutional: No fever/chills Eyes: No visual changes. ENT: + Left ear pain and swelling, no sore throat. Cardiovascular: Denies chest pain. Respiratory: Denies shortness of breath. Gastrointestinal: No abdominal pain.  No nausea, no vomiting.  No diarrhea.  No constipation. Genitourinary: Negative for dysuria. Musculoskeletal: Negative for back pain. Skin: Negative for rash. Neurological: Negative for headaches, focal weakness or  numbness.  ____________________________________________   PHYSICAL EXAM:  VITAL SIGNS: ED Triage Vitals  Enc Vitals Group     BP 11/10/20 0734 133/85     Pulse Rate 11/10/20 0734 78     Resp 11/10/20 0734 15     Temp 11/10/20 0734 97.8 F (36.6 C)     Temp Source 11/10/20 0734 Oral     SpO2 11/10/20 0734 100 %     Weight 11/10/20 0735 125 lb (56.7 kg)     Height 11/10/20 0735 5\' 8"  (1.727 m)     Head Circumference --      Peak Flow --      Pain Score 11/10/20 0735 3     Pain Loc --      Pain Edu? --      Excl. in GC? --     Constitutional: Alert and oriented. Well appearing and in no acute distress. Eyes: Conjunctivae are normal. PERRL. EOMI. Head: Atraumatic. Nose: No congestion/rhinnorhea. Ears: The right external ear is normal-appearing.  TMs are visualized with some mild scarring, no bulging or erythema.  The left external ear appears partially malformed with swelling and some overlying erythema and tenderness.  No fluctuance or clear area of drainage.  TM is visualized without any bulging or erythema. Mouth/Throat: Mucous membranes are moist.  Oropharynx non-erythematous. Neck: No stridor.   Cardiovascular: Normal rate, regular rhythm. Grossly normal heart sounds.  Good peripheral circulation. Respiratory: Normal respiratory effort.  No retractions. Lungs CTAB. Gastrointestinal: Soft and nontender. No distention. No abdominal bruits. No CVA tenderness. Musculoskeletal: No lower extremity tenderness nor edema.  No joint effusions. Neurologic:  Normal speech and language. No gross focal neurologic deficits are appreciated. No gait instability. Skin:  Skin is warm, dry and intact. No rash noted. Psychiatric: Mood and affect are normal. Speech and behavior are normal.   ____________________________________________   INITIAL IMPRESSION / ASSESSMENT AND PLAN / ED COURSE  As part of my medical decision making, I reviewed the following data within the electronic medical  record:  Nursing notes reviewed and incorporated and Notes from prior ED visits        Patient is a 75 year old male who presents to the emergency department for evaluation of left ear pain and swelling over the last 3 months.  See HPI for further details.  In triage, patient has normal vital signs.  Physical exam as above.  Concern for perichondritis, though the chronicity makes it unlikely that there has been perichondritis persistently.  Discussed with the patient that the deformity may or may not clear given the longevity of his symptoms, however given the overlying tenderness and erythema, feel that antibiotics are appropriate to cover for possible pseudomonal infection.  Recommended close ENT follow-up.  Patient receives his care at the [facility] and was instructed to call them, however was given local ENT  info in case he is unable to follow-up with him.  Patient is amenable with plan, stable at this time for outpatient follow-up.  Return precautions were discussed.      ____________________________________________   FINAL CLINICAL IMPRESSION(S) / ED DIAGNOSES  Final diagnoses:  Perichondritis of left ear     ED Discharge Orders          Ordered    levofloxacin (LEVAQUIN) 750 MG tablet  Daily        11/10/20 0746             Note:  This document was prepared using Dragon voice recognition software and may include unintentional dictation errors.    Lucy Chris, PA 11/11/20 1619    Minna Antis, MD 11/12/20 1446

## 2020-11-10 NOTE — ED Triage Notes (Signed)
Pt c/o left ear swelling and pain of the out ear for the past 3 mo

## 2020-11-10 NOTE — Discharge Instructions (Addendum)
Please take antibiotic as prescribed.  Follow-up with ENT.  If you are unable to get in with a [facility] associated with the [facility], you may contact the ENTs information above for follow-up.  Please return to the emergency department if you experience any fever or worsening.

## 2021-09-03 ENCOUNTER — Other Ambulatory Visit: Payer: Self-pay

## 2021-09-03 ENCOUNTER — Encounter: Payer: Self-pay | Admitting: Emergency Medicine

## 2021-09-03 ENCOUNTER — Emergency Department
Admission: EM | Admit: 2021-09-03 | Discharge: 2021-09-03 | Disposition: A | Discharge status: Home / Self Care | Payer: Medicare Other | Attending: Emergency Medicine | Admitting: Emergency Medicine

## 2021-09-03 DIAGNOSIS — W57XXXA Bitten or stung by nonvenomous insect and other nonvenomous arthropods, initial encounter: Secondary | ICD-10-CM

## 2021-09-03 DIAGNOSIS — S00462D Insect bite (nonvenomous) of left ear, subsequent encounter: Secondary | ICD-10-CM | POA: Insufficient documentation

## 2021-09-03 DIAGNOSIS — H6122 Impacted cerumen, left ear: Secondary | ICD-10-CM

## 2021-09-03 DIAGNOSIS — W57XXXD Bitten or stung by nonvenomous insect and other nonvenomous arthropods, subsequent encounter: Secondary | ICD-10-CM | POA: Diagnosis not present

## 2021-09-03 DIAGNOSIS — H9202 Otalgia, left ear: Secondary | ICD-10-CM | POA: Diagnosis present

## 2021-09-03 MED ORDER — CARBAMIDE PEROXIDE 6.5 % OT SOLN: 5.0000 [drp] | Freq: Two times a day (BID) | OTIC | 0 refills | Status: AC

## 2021-09-03 MED ORDER — DOXYCYCLINE HYCLATE 100 MG PO CAPS: 100.0000 mg | ORAL_CAPSULE | Freq: Two times a day (BID) | ORAL | 0 refills | Status: DC

## 2021-09-03 NOTE — ED Provider Notes (Signed)
? ?[facility] ?Provider Note ? ? ? Event Date/Time  ? First MD Initiated Contact with Patient 09/03/21 [phone]   ?  (approximate) ? ? ?History  ? ?Otalgia ? ? ?HPI ? ?Ross Fisher is a 76 y.o. male   complains of left ear pain most recent only and also a scab that he is able to peel off his ear which causes it to bleed.  He denies any fever or chills.  He denies any upper respiratory symptoms.  He currently is being seen at the [facility] for atrial fibs, COPD, hypertension, hyperlipidemia. ? ?  ? ? ?Physical Exam  ? ?Triage Vital Signs: ?ED Triage Vitals  ?Enc Vitals Group  ?   BP 09/03/21 0808 126/83  ?   Pulse Rate 09/03/21 0808 79  ?   Resp 09/03/21 0808 16  ?   Temp 09/03/21 0806 98.5 ?F (36.9 ?C)  ?   Temp Source 09/03/21 0806 Oral  ?   SpO2 09/03/21 0808 96 %  ?   Weight 09/03/21 0807 115 lb (52.2 kg)  ?   Height 09/03/21 0807 5\' 6"  (1.676 m)  ?   Head Circumference --   ?   Peak Flow --   ?   Pain Score 09/03/21 0806 3  ?   Pain Loc --   ?   Pain Edu? --   ?   Excl. in GC? --   ? ? ?Most recent vital signs: ?Vitals:  ? 09/03/21 0806 09/03/21 0808  ?BP:  126/83  ?Pulse:  79  ?Resp:  16  ?Temp: 98.5 ?F (36.9 ?C)   ?SpO2:  96%  ? ? ? ?General: Awake, no distress.  ?CV:  Good peripheral perfusion.  ?Resp:  Normal effort.  ?Abd:  No distention.  ?Other:  Right EAC clear, TM is dull.  Left EAC is occluded with cerumen.  Neck is supple without cervical lymphadenopathy.  Patient also has a area on his right leg that he noticed a couple of days ago and during examination it was noted that this was an engorged tick that was embedded.  Patient was made aware.  Tick was removed in entirety including his head.  Patient was made aware. ? ? ?ED Results / Procedures / Treatments  ? ?Labs ?(all labs ordered are listed, but only abnormal results are displayed) ?Labs Reviewed - No data to display ? ? ? ? ?PROCEDURES: ? ?Critical Care performed:  ? ?Procedures ? ? ?MEDICATIONS ORDERED IN  ED: ?Medications - No data to display ? ? ?IMPRESSION / MDM / ASSESSMENT AND PLAN / ED COURSE  ?I reviewed the triage vital signs and the nursing notes. ? ? ?Differential diagnosis includes, but is not limited to, otitis media, external otitis, cerumen impaction, upper respiratory infection. ? ?76 year old male presents to the ED with complaint of left ear pain without upper respiratory symptoms.  Also during his exam his right leg was noted to have a large engorged tick on his leg that had been there for an unknown period of time.  Patient was made aware that he has a lot of wax in his left ear and was given a prescription for Debrox to apply to his ear twice daily to loosen this wax.  He was also placed on doxycycline twice daily due to the tick bite an unknown length of time that is been embedded in his skin.  Patient already has an appointment at the [facility] for his routine exam  and he is encouraged to keep this appointment and also follow-up with them if any continued problems. ? ? ? ?  ? ? ?FINAL CLINICAL IMPRESSION(S) / ED DIAGNOSES  ? ?Final diagnoses:  ?Tick bite with subsequent removal of tick  ?Impacted cerumen of left ear  ? ? ? ?Rx / DC Orders  ? ?ED Discharge Orders   ? ?      Ordered  ?  doxycycline (VIBRAMYCIN) 100 MG capsule  2 times daily       ? 09/03/21 0848  ?  carbamide peroxide (DEBROX) 6.5 % OTIC solution  2 times daily       ? 09/03/21 0848  ? ?  ?  ? ?  ? ? ? ?Note:  This document was prepared using Dragon voice recognition software and may include unintentional dictation errors. ?  ?Tommi Rumps, PA-C ?09/03/21 1449 ? ?  ?Gilles Chiquito, MD ?09/03/21 1630 ? ?

## 2021-09-03 NOTE — ED Triage Notes (Signed)
Pt to ED via POV c/o left ear pain and bleeding when he peels the scab off the sore in his ear. Pt is currently in NAD.  ?

## 2021-09-03 NOTE — Discharge Instructions (Addendum)
Keep your appointment with the [facility] in August.  Take medication as directed for the tick bite and use ear drops to left ear.  ?

## 2021-09-03 NOTE — ED Notes (Signed)
See triage note  presents with pain to left ear  states he had similar problem about 1 year ago  states he is a having some bleeding ?

## 2022-07-10 IMAGING — CR DG CERVICAL SPINE 2 OR 3 VIEWS
3 series · 3 of 3 positions shown · non-contrast
Comparison: None.

CLINICAL DATA: Neck and left arm pain.

EXAM:
CERVICAL SPINE - 2-3 VIEW

[c-spine lat]
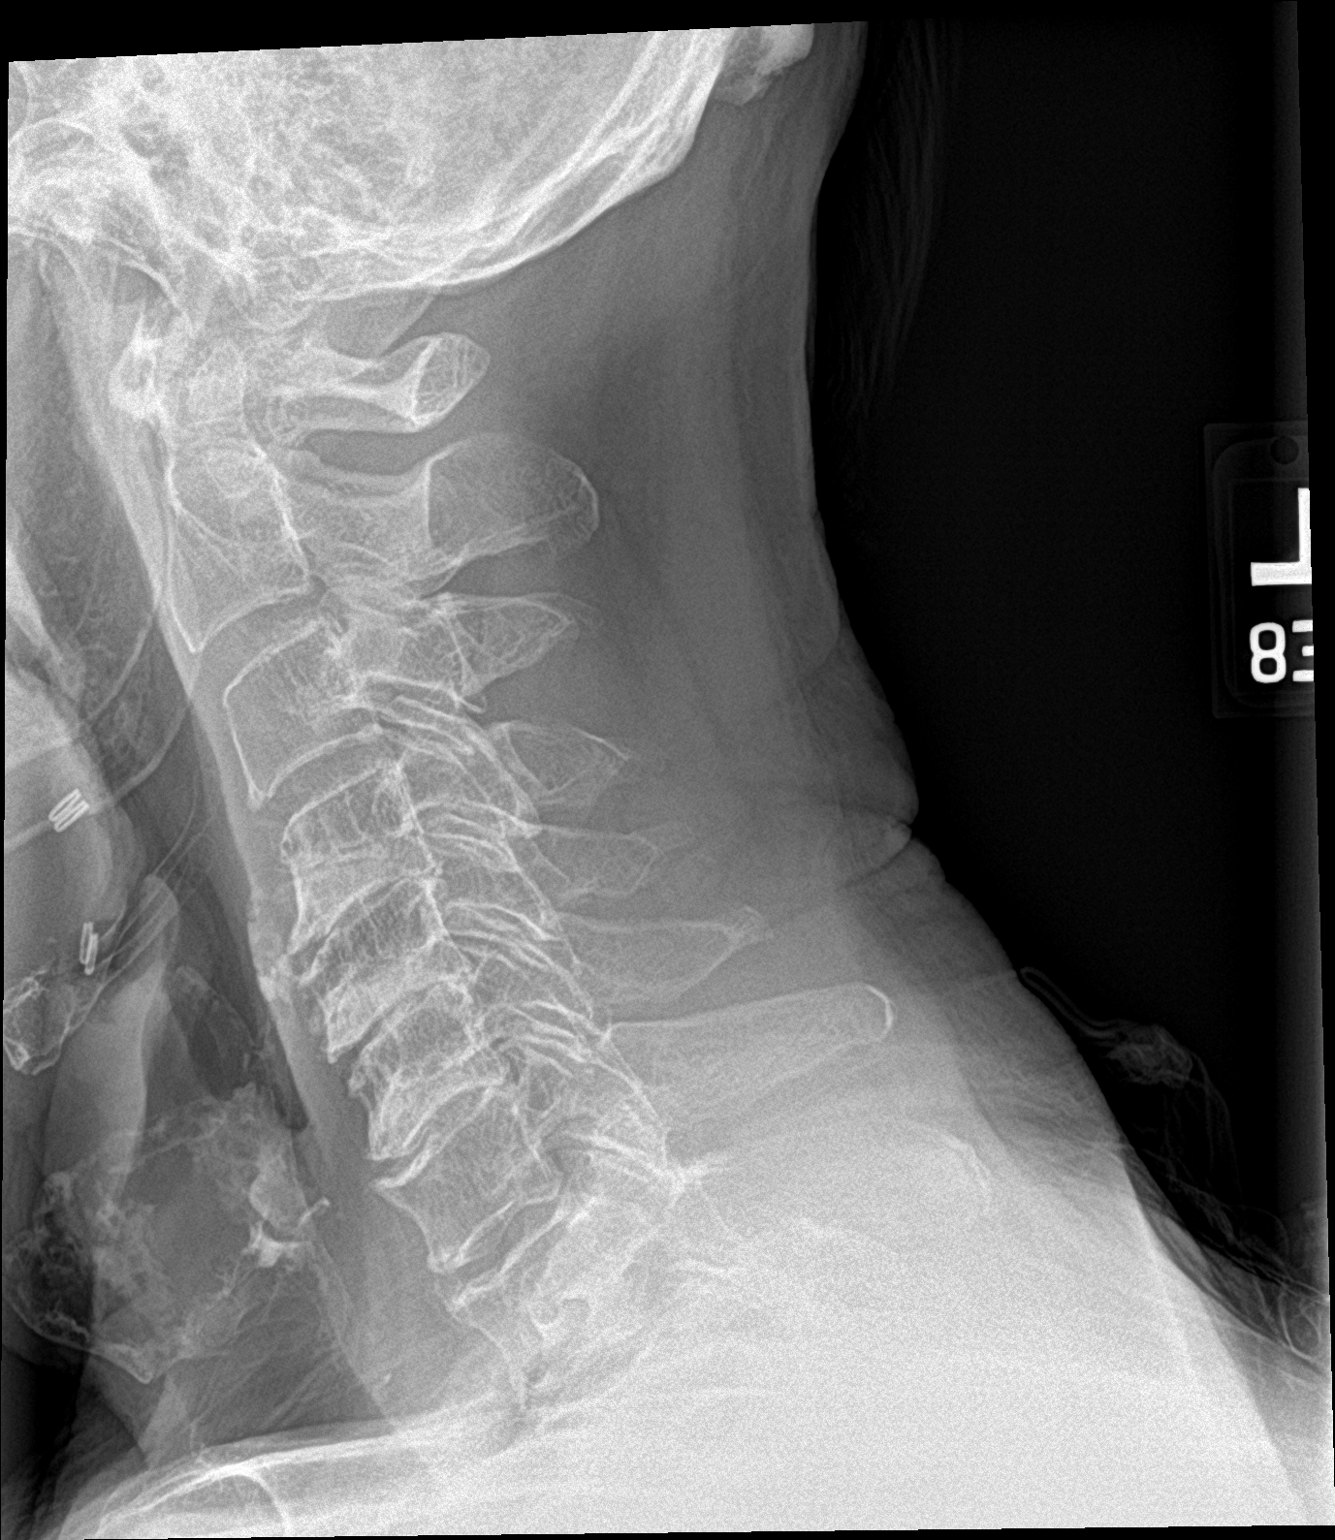

[c-spine ap]
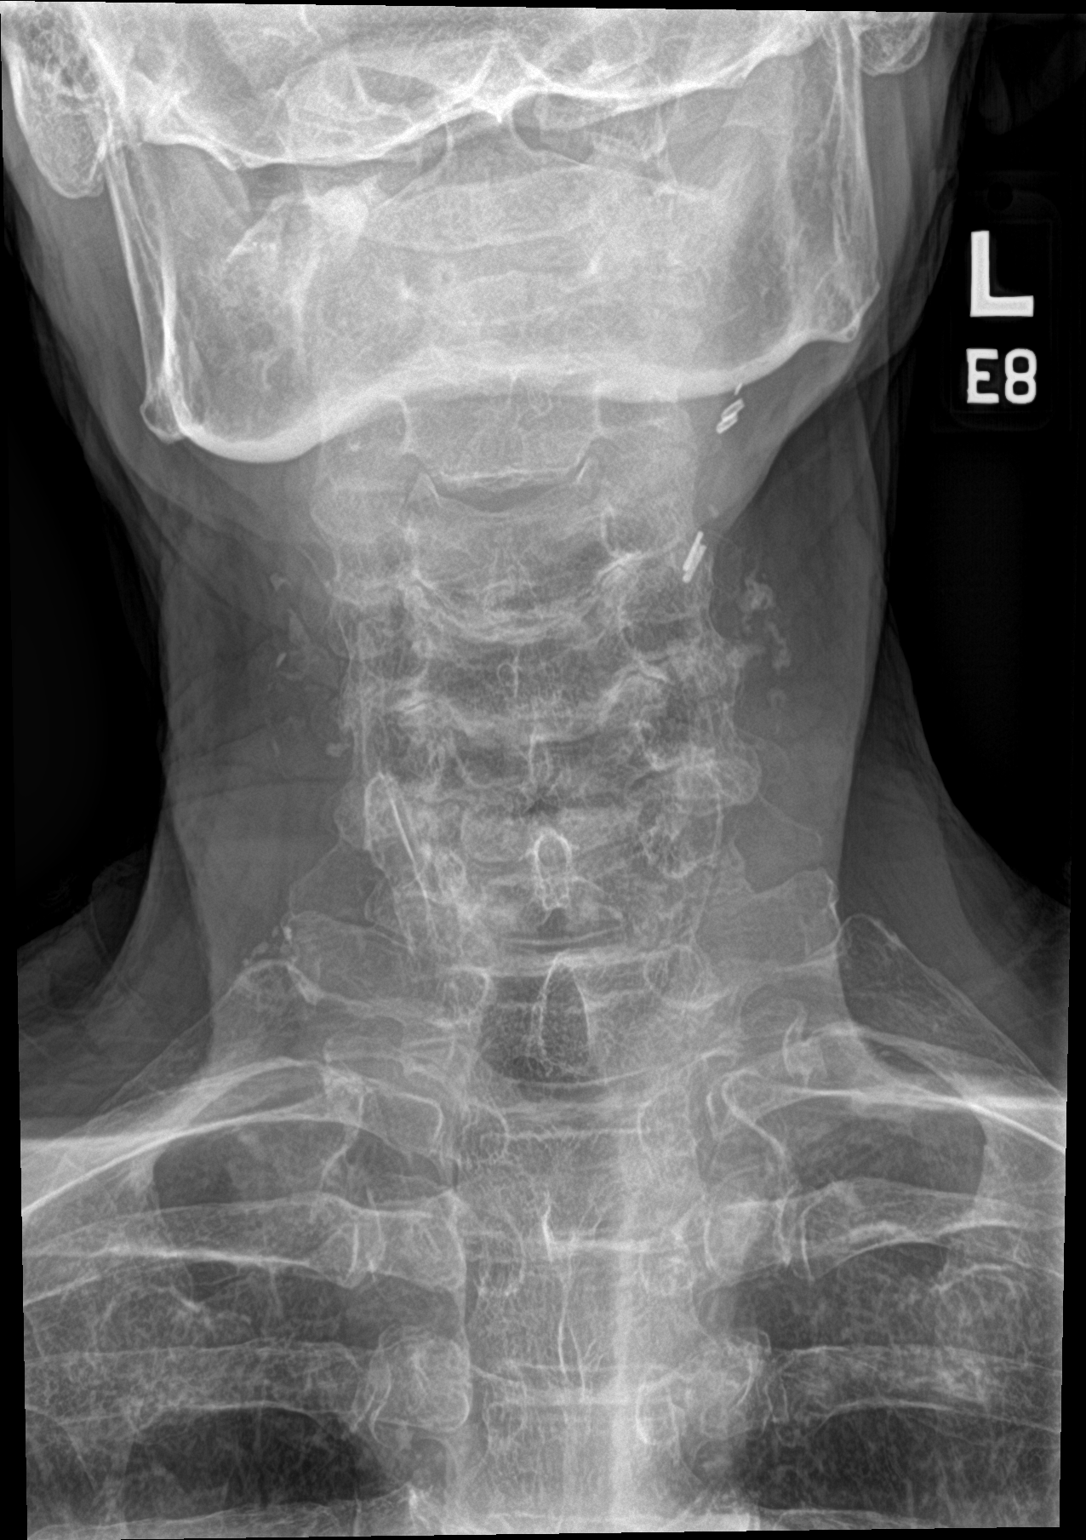

[c-spine open mouth]
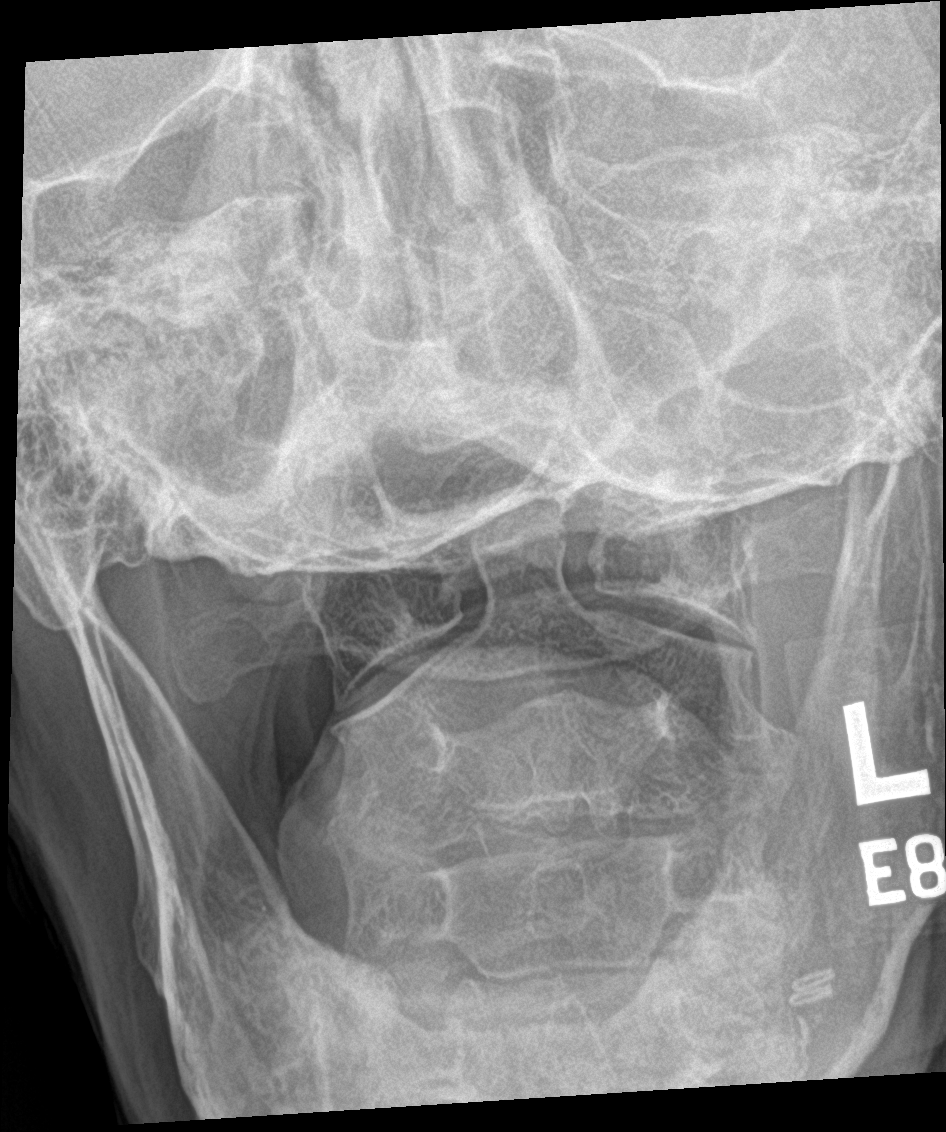

[3 of 3 positions shown; findings below may reference images not displayed]

FINDINGS: Straightening of the normal lordosis. Disc space narrowing at C4-5,
C5-6 and C6-7. Mild facet osteoarthritis. This could be symptomatic.

Carotid calcification is noted.  Surgical clips in the left neck.
IMPRESSION: 1. Degenerative disc disease and degenerative facet disease in the
mid and lower cervical region. This could be symptomatic.
2. Carotid calcification incidentally noted.

## 2022-07-10 IMAGING — CR DG LUMBAR SPINE 2-3V
3 series · 3 of 3 positions shown · non-contrast
Comparison: CT abdomen 11/29/2016

CLINICAL DATA: Back pain and right radicular pain.

EXAM:
LUMBAR SPINE - 2-3 VIEW

[l-spine ap]
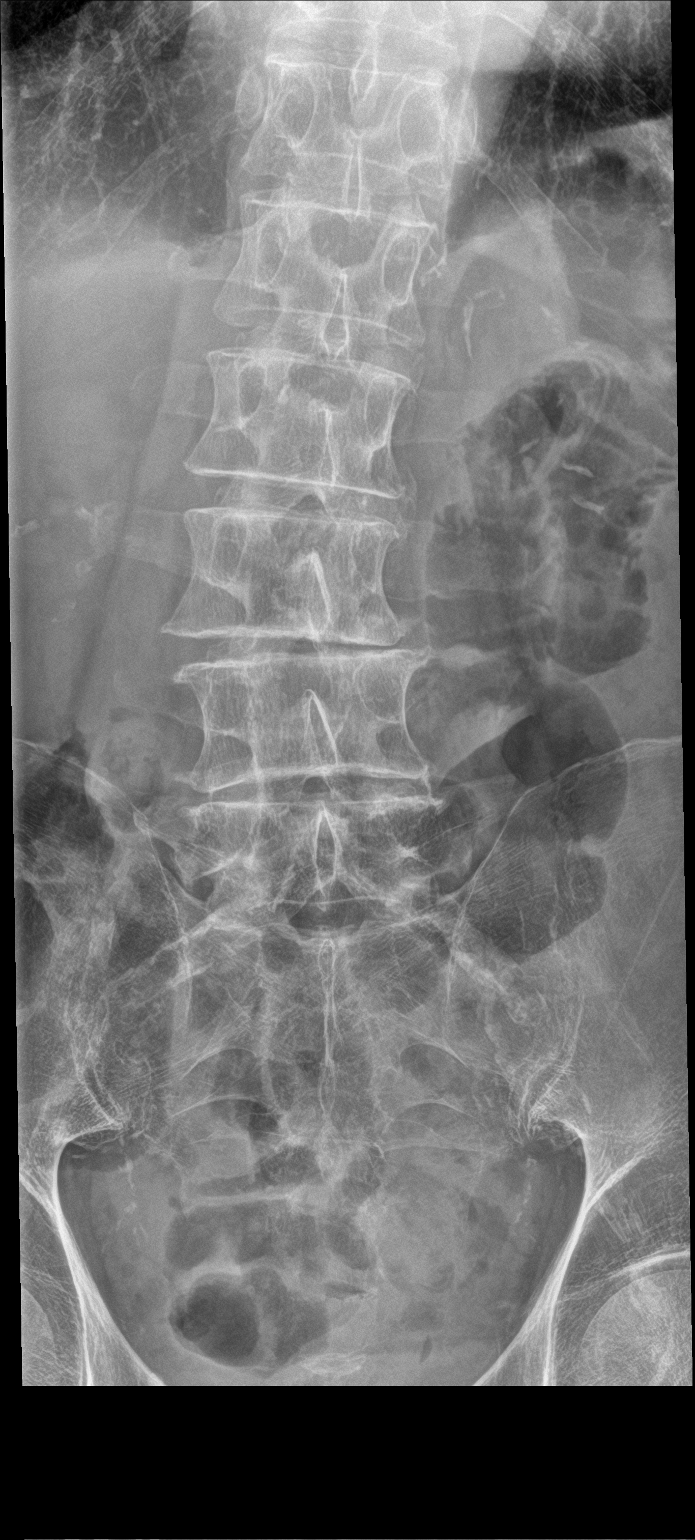

[l-spine lat]
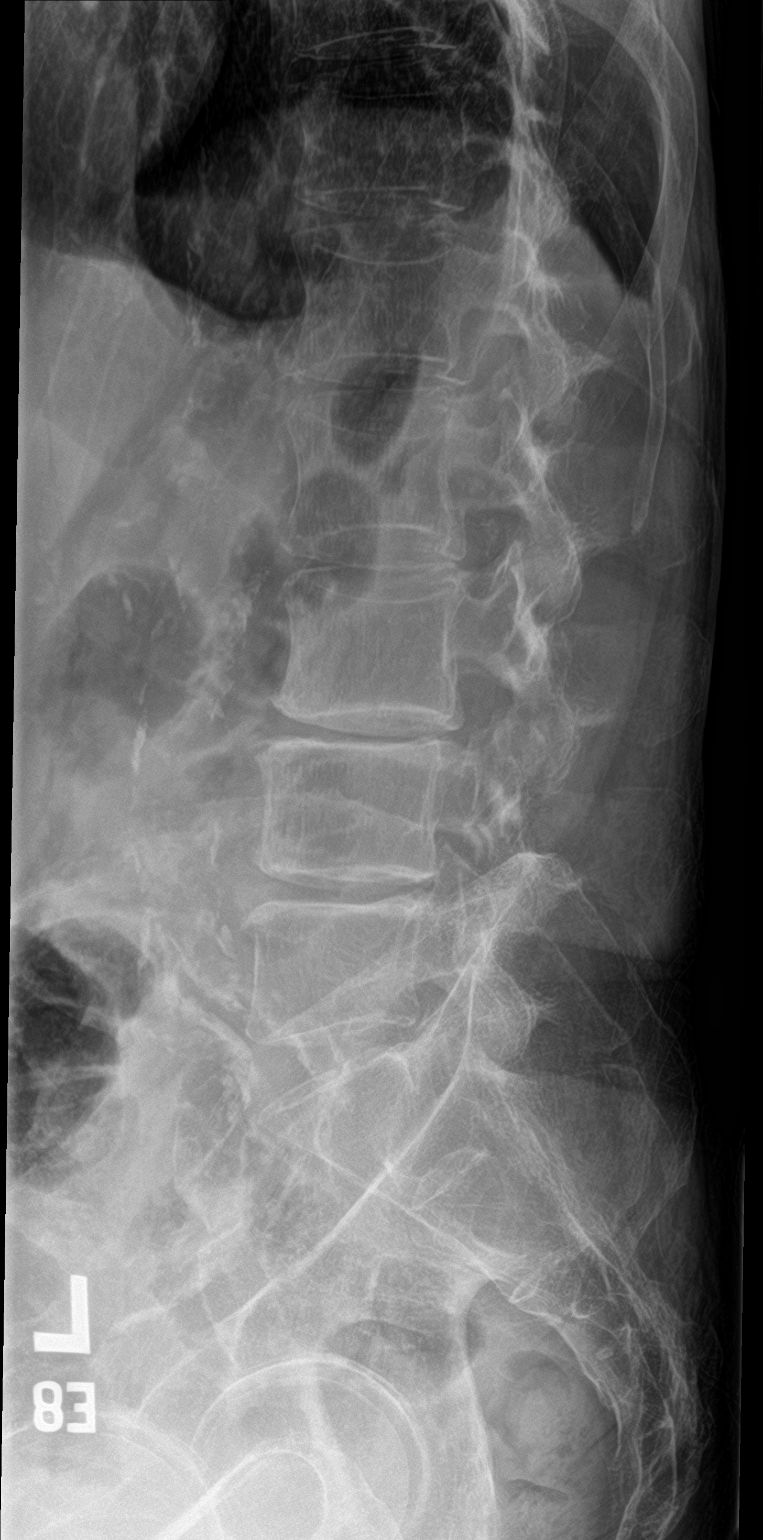

[l-spine spot]
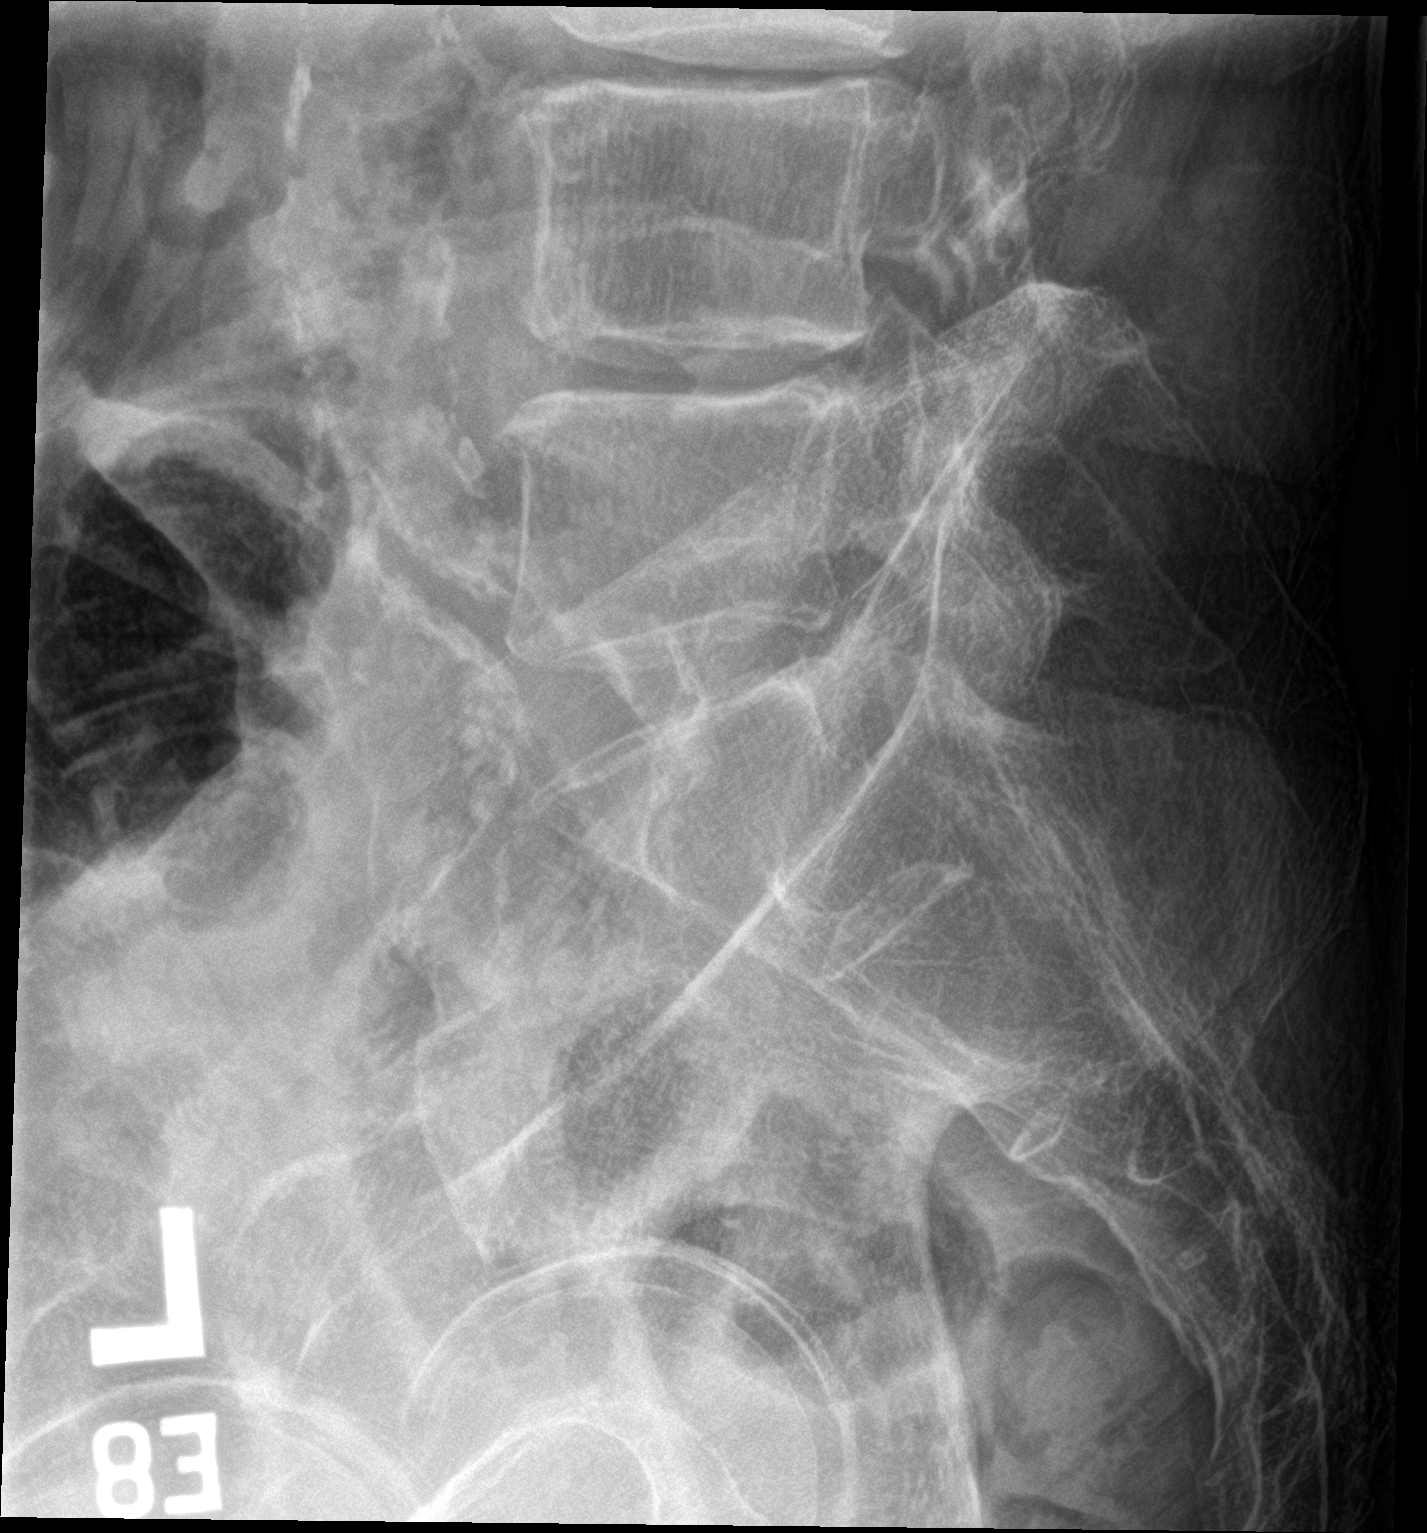

[3 of 3 positions shown; findings below may reference images not displayed]

FINDINGS: Scoliotic curvature convex to the right. Straightening of the normal
lumbar lordosis. Disc space narrowing most pronounced at L3-4 but
also at L4-5. No evidence of fracture. Aortic atherosclerotic
calcification is noted.
IMPRESSION: 1. Scoliotic curvature convex to the right. Degenerative disc
disease most pronounced at L3-4 and L4-5. This could be symptomatic.
2. Aortic atherosclerosis.

## 2023-03-03 ENCOUNTER — Emergency Department (HOSPITAL_COMMUNITY): Payer: No Typology Code available for payment source

## 2023-03-03 ENCOUNTER — Observation Stay (HOSPITAL_COMMUNITY)
Admission: EM | Admit: 2023-03-03 | Discharge: 2023-03-04 | Disposition: A | Discharge status: Home / Self Care | Payer: No Typology Code available for payment source | Attending: Internal Medicine | Admitting: Internal Medicine

## 2023-03-03 DIAGNOSIS — J449 Chronic obstructive pulmonary disease, unspecified: Secondary | ICD-10-CM | POA: Diagnosis not present

## 2023-03-03 DIAGNOSIS — E860 Dehydration: Secondary | ICD-10-CM | POA: Diagnosis not present

## 2023-03-03 DIAGNOSIS — I480 Paroxysmal atrial fibrillation: Secondary | ICD-10-CM | POA: Insufficient documentation

## 2023-03-03 DIAGNOSIS — Z79899 Other long term (current) drug therapy: Secondary | ICD-10-CM | POA: Insufficient documentation

## 2023-03-03 DIAGNOSIS — Z7901 Long term (current) use of anticoagulants: Secondary | ICD-10-CM | POA: Diagnosis not present

## 2023-03-03 DIAGNOSIS — I110 Hypertensive heart disease with heart failure: Secondary | ICD-10-CM | POA: Diagnosis not present

## 2023-03-03 DIAGNOSIS — G4700 Insomnia, unspecified: Secondary | ICD-10-CM | POA: Diagnosis not present

## 2023-03-03 DIAGNOSIS — G8929 Other chronic pain: Secondary | ICD-10-CM | POA: Diagnosis not present

## 2023-03-03 DIAGNOSIS — N179 Acute kidney failure, unspecified: Principal | ICD-10-CM | POA: Diagnosis present

## 2023-03-03 DIAGNOSIS — R112 Nausea with vomiting, unspecified: Secondary | ICD-10-CM | POA: Diagnosis present

## 2023-03-03 DIAGNOSIS — R634 Abnormal weight loss: Secondary | ICD-10-CM | POA: Diagnosis not present

## 2023-03-03 DIAGNOSIS — I5031 Acute diastolic (congestive) heart failure: Secondary | ICD-10-CM | POA: Diagnosis not present

## 2023-03-03 LAB — CBC WITH DIFFERENTIAL/PLATELET
Abs Immature Granulocytes: 0.03 10*3/uL (ref 0.00–0.07)
Basophils Absolute: 0.1 10*3/uL (ref 0.0–0.1)
Basophils Relative: 1 %
Eosinophils Absolute: 0.1 10*3/uL (ref 0.0–0.5)
Eosinophils Relative: 1 %
HCT: 42 % (ref 39.0–52.0)
Hemoglobin: 14 g/dL (ref 13.0–17.0)
Immature Granulocytes: 0 %
Lymphocytes Relative: 15 %
Lymphs Abs: 1.4 10*3/uL (ref 0.7–4.0)
MCH: 33.1 pg (ref 26.0–34.0)
MCHC: 33.3 g/dL (ref 30.0–36.0)
MCV: 99.3 fL (ref 80.0–100.0)
Monocytes Absolute: 0.7 10*3/uL (ref 0.1–1.0)
Monocytes Relative: 7 %
Neutro Abs: 6.9 10*3/uL (ref 1.7–7.7)
Neutrophils Relative %: 76 %
Platelets: 261 10*3/uL (ref 150–400)
RBC: 4.23 MIL/uL (ref 4.22–5.81)
RDW: 13.4 % (ref 11.5–15.5)
WBC: 9.1 10*3/uL (ref 4.0–10.5)
nRBC: 0 % (ref 0.0–0.2)

## 2023-03-03 LAB — COMPREHENSIVE METABOLIC PANEL
ALT: 16 U/L (ref 0–44)
AST: 20 U/L (ref 15–41)
Albumin: 4 g/dL (ref 3.5–5.0)
Alkaline Phosphatase: 50 U/L (ref 38–126)
Anion gap: 17 — ABNORMAL HIGH (ref 5–15)
BUN: 39 mg/dL — ABNORMAL HIGH (ref 8–23)
CO2: 18 mmol/L — ABNORMAL LOW (ref 22–32)
Calcium: 9.2 mg/dL (ref 8.9–10.3)
Chloride: 102 mmol/L (ref 98–111)
Creatinine, Ser: 1.86 mg/dL — ABNORMAL HIGH (ref 0.61–1.24)
GFR, Estimated: 37 mL/min — ABNORMAL LOW (ref >60)
Glucose, Bld: 99 mg/dL (ref 70–99)
Potassium: 4.5 mmol/L (ref 3.5–5.1)
Sodium: 137 mmol/L (ref 135–145)
Total Bilirubin: 1.4 mg/dL — ABNORMAL HIGH (ref 0.3–1.2)
Total Protein: 6.9 g/dL (ref 6.5–8.1)

## 2023-03-03 LAB — TROPONIN I (HIGH SENSITIVITY)
Troponin I (High Sensitivity): 22 ng/L — ABNORMAL HIGH (ref <18)
Troponin I (High Sensitivity): 20 ng/L — ABNORMAL HIGH (ref <18)

## 2023-03-03 LAB — MAGNESIUM: Magnesium: 2 mg/dL (ref 1.7–2.4)

## 2023-03-03 LAB — CBG MONITORING, ED: Glucose-Capillary: 86 mg/dL (ref 70–99)

## 2023-03-03 LAB — LIPASE, BLOOD: Lipase: 19 U/L (ref 11–51)

## 2023-03-03 MED ORDER — METOCLOPRAMIDE HCL 5 MG/ML IJ SOLN
10.0000 mg | Freq: Once | INTRAMUSCULAR | Status: AC
  Administered 2023-03-03: 10 mg via INTRAVENOUS
  Filled 2023-03-03: qty 2

## 2023-03-03 MED ORDER — METOCLOPRAMIDE HCL 5 MG/ML IJ SOLN
10.0000 mg | Freq: Once | INTRAMUSCULAR | Status: DC
  Filled 2023-03-03: qty 2

## 2023-03-03 MED ORDER — ACETAMINOPHEN-CODEINE 300-30 MG PO TABS: 1.0000 | ORAL_TABLET | Freq: Four times a day (QID) | ORAL | Status: DC | PRN

## 2023-03-03 MED ORDER — NICOTINE 21 MG/24HR TD PT24
21.0000 mg | MEDICATED_PATCH | Freq: Every day | TRANSDERMAL | Status: DC
  Filled 2023-03-03: qty 1

## 2023-03-03 MED ORDER — TRAZODONE HCL 50 MG PO TABS
200.0000 mg | ORAL_TABLET | Freq: Every day | ORAL | Status: DC
  Administered 2023-03-04: 200 mg via ORAL
  Filled 2023-03-03: qty 4

## 2023-03-03 MED ORDER — PROCHLORPERAZINE EDISYLATE 10 MG/2ML IJ SOLN: 10.0000 mg | Freq: Four times a day (QID) | INTRAMUSCULAR | Status: DC | PRN

## 2023-03-03 MED ORDER — LACTATED RINGERS IV BOLUS
1000.0000 mL | Freq: Once | INTRAVENOUS | Status: AC
  Administered 2023-03-03: 1000 mL via INTRAVENOUS

## 2023-03-03 MED ORDER — CARVEDILOL 25 MG PO TABS
25.0000 mg | ORAL_TABLET | Freq: Two times a day (BID) | ORAL | Status: DC
  Administered 2023-03-04: 25 mg via ORAL
  Filled 2023-03-03: qty 1

## 2023-03-03 MED ORDER — DIPHENHYDRAMINE HCL 50 MG/ML IJ SOLN
12.5000 mg | Freq: Once | INTRAMUSCULAR | Status: AC
  Administered 2023-03-03: 12.5 mg via INTRAVENOUS
  Filled 2023-03-03: qty 1

## 2023-03-03 MED ORDER — ONDANSETRON HCL 4 MG/2ML IJ SOLN: 4.0000 mg | Freq: Once | INTRAMUSCULAR | Status: DC

## 2023-03-03 MED ORDER — APIXABAN 5 MG PO TABS
5.0000 mg | ORAL_TABLET | Freq: Two times a day (BID) | ORAL | Status: DC
  Administered 2023-03-04: 5 mg via ORAL
  Filled 2023-03-03: qty 1

## 2023-03-03 MED ORDER — IOHEXOL 350 MG/ML SOLN
60.0000 mL | Freq: Once | INTRAVENOUS | Status: AC | PRN
  Administered 2023-03-03: 60 mL via INTRAVENOUS

## 2023-03-03 MED ORDER — DEXTROSE-SODIUM CHLORIDE 5-0.45 % IV SOLN: INTRAVENOUS | Status: AC

## 2023-03-03 MED ORDER — ATORVASTATIN CALCIUM 10 MG PO TABS
20.0000 mg | ORAL_TABLET | Freq: Every day | ORAL | Status: DC
  Administered 2023-03-04: 20 mg via ORAL
  Filled 2023-03-03: qty 2

## 2023-03-03 MED ORDER — ENOXAPARIN SODIUM 30 MG/0.3ML IJ SOSY: 30.0000 mg | PREFILLED_SYRINGE | INTRAMUSCULAR | Status: DC

## 2023-03-03 MED ORDER — ALBUTEROL SULFATE (2.5 MG/3ML) 0.083% IN NEBU: 2.5000 mg | INHALATION_SOLUTION | Freq: Four times a day (QID) | RESPIRATORY_TRACT | Status: DC | PRN

## 2023-03-03 MED ORDER — FLUTICASONE FUROATE-VILANTEROL 200-25 MCG/ACT IN AEPB
1.0000 | INHALATION_SPRAY | Freq: Every day | RESPIRATORY_TRACT | Status: DC
  Administered 2023-03-04: 1 via RESPIRATORY_TRACT
  Filled 2023-03-03: qty 28

## 2023-03-03 MED ORDER — METHOCARBAMOL 500 MG PO TABS: 750.0000 mg | ORAL_TABLET | Freq: Three times a day (TID) | ORAL | Status: DC | PRN

## 2023-03-03 MED ORDER — AMLODIPINE BESYLATE 10 MG PO TABS
10.0000 mg | ORAL_TABLET | Freq: Every day | ORAL | Status: DC
  Administered 2023-03-04: 10 mg via ORAL
  Filled 2023-03-03: qty 1

## 2023-03-03 NOTE — ED Notes (Signed)
ED TO INPATIENT HANDOFF REPORT  ED Nurse Name and Phone #: Gillis Ends [phone]  S Name/Age/Gender Ross Fisher 77 y.o. male Room/Bed: 003C/003C  Code Status   Code Status: Limited: Do not attempt resuscitation (DNR) -DNR-LIMITED -Do Not Intubate/DNI   Home/SNF/Other Home Patient oriented to: self, place, time, and situation Is this baseline? Yes   Triage Complete: Triage complete  Chief Complaint AKI (acute kidney injury) (HCC) [N17.9]  Triage Note PT BIB EMS from home after feeling ill with N/V for 1 week. Family called 911. Pt denies any PO intake solid and fluids, is not a diabetic. Upon EMS arrival, BP was 80/50 & BG 65. EMS gave 500 NS & 8g D10 resulting in BG 160 then 121 upon ED arrival. BP improved to 120/76. Per EMS patient is at baseline, but refused to answer orientation questions stating "These are silly questions and I will not be answering them".    Allergies Allergies  Allergen Reactions   Gabapentin Diarrhea   Lisinopril     Other Reaction(s): Serum creatinine raised    Level of Care/Admitting Diagnosis ED Disposition     ED Disposition  Admit   Condition  --   Comment  Hospital Area: [facility] [[id number]]  Level of Care: Med-Surg [16]  May place patient in observation at [facility] or [facility] if equivalent level of care is available:: No  Covid Evaluation: Asymptomatic - no recent exposure (last 10 days) testing not required  Diagnosis: AKI (acute kidney injury) [facility]) [[id number]]  Admitting Physician: Mercie Eon [[id number]]  Attending Physician: Mercie Eon [[id number]]          B Medical/Surgery History Past Medical History:  Diagnosis Date   Back pain    COPD (chronic obstructive pulmonary disease) (HCC)    High cholesterol    Hypercholesteremia    Hypertension    Past Surgical History:  Procedure Laterality Date   CATARACT EXTRACTION     ROTATOR CUFF REPAIR     bilat     A IV Location/Drains/Wounds Patient  Lines/Drains/Airways Status     Active Line/Drains/Airways     Name Placement date Placement time Site Days   Peripheral IV 03/03/23 18 G Anterior;Left Forearm 03/03/23  1542  Forearm  less than 1            Intake/Output Last 24 hours  Intake/Output Summary (Last 24 hours) at 03/03/2023 2326 Last data filed at 03/03/2023 2109 Gross per 24 hour  Intake 1000 ml  Output --  Net 1000 ml    Labs/Imaging Results for orders placed or performed during the hospital encounter of 03/03/23 (from the past 48 hour(s))  Comprehensive metabolic panel     Status: Abnormal   Collection Time: 03/03/23  4:21 PM  Result Value Ref Range   Sodium 137 135 - 145 mmol/L   Potassium 4.5 3.5 - 5.1 mmol/L   Chloride 102 98 - 111 mmol/L   CO2 18 (L) 22 - 32 mmol/L   Glucose, Bld 99 70 - 99 mg/dL    Comment: Glucose reference range applies only to samples taken after fasting for at least 8 hours.   BUN 39 (H) 8 - 23 mg/dL   Creatinine, Ser 2.13 (H) 0.61 - 1.24 mg/dL   Calcium 9.2 8.9 - 08.6 mg/dL   Total Protein 6.9 6.5 - 8.1 g/dL   Albumin 4.0 3.5 - 5.0 g/dL   AST 20 15 - 41 U/L   ALT 16 0 - 44 U/L  Alkaline Phosphatase 50 38 - 126 U/L   Total Bilirubin 1.4 (H) 0.3 - 1.2 mg/dL   GFR, Estimated 37 (L) >60 mL/min    Comment: (NOTE) Calculated using the CKD-EPI Creatinine Equation (2021)    Anion gap 17 (H) 5 - 15    Comment: Performed at [facility], [address], [place], [place] [address]  Lipase, blood     Status: None   Collection Time: 03/03/23  4:21 PM  Result Value Ref Range   Lipase 19 11 - 51 U/L    Comment: Performed at [facility], [address], [place], [place] [address]  CBC with Diff     Status: None   Collection Time: 03/03/23  4:21 PM  Result Value Ref Range   WBC 9.1 4.0 - 10.5 K/uL   RBC 4.23 4.22 - 5.81 MIL/uL   Hemoglobin 14.0 13.0 - 17.0 g/dL   HCT 82.9 56.2 - 13.0 %   MCV 99.3 80.0 - 100.0 fL   MCH 33.1 26.0 - 34.0 pg   MCHC 33.3 30.0 - 36.0 g/dL    RDW 86.5 78.4 - 69.6 %   Platelets 261 150 - 400 K/uL   nRBC 0.0 0.0 - 0.2 %   Neutrophils Relative % 76 %   Neutro Abs 6.9 1.7 - 7.7 K/uL   Lymphocytes Relative 15 %   Lymphs Abs 1.4 0.7 - 4.0 K/uL   Monocytes Relative 7 %   Monocytes Absolute 0.7 0.1 - 1.0 K/uL   Eosinophils Relative 1 %   Eosinophils Absolute 0.1 0.0 - 0.5 K/uL   Basophils Relative 1 %   Basophils Absolute 0.1 0.0 - 0.1 K/uL   Immature Granulocytes 0 %   Abs Immature Granulocytes 0.03 0.00 - 0.07 K/uL    Comment: Performed at [facility], [address], [place], [place] [address]  Troponin I (High Sensitivity)     Status: Abnormal   Collection Time: 03/03/23  4:21 PM  Result Value Ref Range   Troponin I (High Sensitivity) 22 (H) <18 ng/L    Comment: (NOTE) Elevated high sensitivity troponin I (hsTnI) values and significant  changes across serial measurements may suggest ACS but many other  chronic and acute conditions are known to elevate hsTnI results.  Refer to the "Links" section for chest pain algorithms and additional  guidance. Performed at [facility], [address] [place], [place] [address]   Magnesium     Status: None   Collection Time: 03/03/23  4:21 PM  Result Value Ref Range   Magnesium 2.0 1.7 - 2.4 mg/dL    Comment: Performed at [facility], [address], [place], [place] [address]  POC CBG, ED     Status: None   Collection Time: 03/03/23  4:28 PM  Result Value Ref Range   Glucose-Capillary 86 70 - 99 mg/dL    Comment: Glucose reference range applies only to samples taken after fasting for at least 8 hours.  Troponin I (High Sensitivity)     Status: Abnormal   Collection Time: 03/03/23  7:34 PM  Result Value Ref Range   Troponin I (High Sensitivity) 20 (H) <18 ng/L    Comment: (NOTE) Elevated high sensitivity troponin I (hsTnI) values and significant  changes across serial measurements may suggest ACS but many other  chronic and acute conditions are known to  elevate hsTnI results.  Refer to the "Links" section for chest pain algorithms and  additional  guidance. Performed at [facility], [address] [place], [place] [address]    CT ABDOMEN PELVIS W CONTRAST  Result Date: 03/03/2023 CLINICAL DATA:  Nausea/vomiting EXAM: CT ABDOMEN AND PELVIS WITH CONTRAST TECHNIQUE: Multidetector CT imaging of the abdomen and pelvis was performed using the standard protocol following bolus administration of intravenous contrast. RADIATION DOSE REDUCTION: This exam was performed according to the departmental dose-optimization program which includes automated exposure control, adjustment of the mA and/or kV according to patient size and/or use of iterative reconstruction technique. 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 Hepatobiliary: Liver is notable for a tiny subcentimeter cysts, benign. Suspected gallbladder sludge (series 2/image 26), with associated plantar changes. No intrahepatic or extrahepatic ductal dilatation. Pancreas: Within normal limits. Spleen: Lobulated spleen. Adrenals/Urinary Tract: Right adrenal gland is within normal limits. 16 mm left adrenal nodule (series 2/image 6), measuring 20-25 HUs following contrast administration, technically indeterminate but similar to 2018 (measuring 15 mm), compatible with a benign adrenal adenoma. Bilateral renal cortical scarring with scattered simple cysts, measuring up to 2.4 cm in the medial right lower kidney (series 2/image 25), benign (Bosniak I). No follow-up is recommended. No renal calculi or hydronephrosis. Bladder is normal limits. Stomach/Bowel: Stomach is within normal limits. No evidence of bowel obstruction. Appendix is not discretely visualized. No colonic wall thickening or inflammatory changes. Vascular/Lymphatic: No evidence of abdominal aortic aneurysm. Atherosclerotic calcifications of the abdominal  aorta and branch vessels, although vessels remain patent. No suspicious abdominopelvic lymphadenopathy. Reproductive: Prostate is unremarkable. Other: No abdominopelvic ascites. Musculoskeletal: Degenerative changes of the lower lumbar spine. IMPRESSION: Gallbladder sludge, without associated inflammatory changes to suggest acute cholecystitis. Additional ancillary findings as above. Electronically Signed   By: Charline Bills M.D.   On: 03/03/2023 19:38    Pending Labs Unresulted Labs (From admission, onward)     Start     Ordered   03/04/23 0500  Basic metabolic panel  Tomorrow morning,   R        03/03/23 2258   03/04/23 0500  CBC  Tomorrow morning,   R        03/03/23 2258   03/04/23 0500  Hemoglobin A1c  Tomorrow morning,   R        03/03/23 2323   03/03/23 1616  Urinalysis, Routine w reflex microscopic -Urine, Clean Catch  Once,   URGENT       Question:  Specimen Source  Answer:  Urine, Clean Catch   03/03/23 1615            Vitals/Pain Today's Vitals   03/03/23 2215 03/03/23 2230 03/03/23 2300 03/03/23 2315  BP: 107/73 (!) 116/93 135/82 127/71  Pulse: 72 72 71 71  Resp: 14 12 14 13   Temp:      TempSrc:      SpO2: 100% 100% 100% 100%  Weight:      Height:      PainSc:        Isolation Precautions No active isolations  Medications Medications  metoCLOPramide (REGLAN) injection 10 mg (10 mg Intravenous Not Given 03/03/23 2151)  acetaminophen-codeine (TYLENOL #3) 300-30 MG per tablet 1 tablet (has no administration in time range)  metoCLOPramide (REGLAN) injection 10 mg (10 mg Intravenous Given 03/03/23 1643)  diphenhydrAMINE (BENADRYL) injection 12.5 mg (12.5 mg Intravenous Given 03/03/23 1643)  iohexol (OMNIPAQUE) 350 MG/ML injection 60 mL (60 mLs Intravenous Contrast Given 03/03/23 1751)  lactated ringers bolus 1,000 mL (0 mLs Intravenous Stopped 03/03/23 2109)  lactated ringers bolus 1,000 mL (1,000 mLs Intravenous New Bag/Given 03/03/23 2325)    Mobility walks  with person assist     Focused Assessments    R Recommendations: See Admitting Provider Note  Report given to:   Additional Notes:  Pt hard 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

## 2023-03-03 NOTE — ED Notes (Signed)
PT to CT.

## 2023-03-03 NOTE — ED Provider Notes (Signed)
[facility] [facility]ERGENCY DEPARTMENT AT [facility] Provider Note   CSN: [id number] Arrival date & time: 03/03/23  1603     History  Chief Complaint  Patient presents with   Nausea    SANAD FEARNOW is a 77 y.o. male.  77 year old male with a history of hypertension, hyperlipidemia, atrial fibrillation on Eliquis, CHF, and COPD who presents to the emergency department with nausea and vomiting.  Patient has been having nausea and vomiting for the past week.  Has difficulty characterizing the vomiting.  Denies any fevers, abdominal pain, or diarrhea.  Does not drink alcohol.  Does not smoke marijuana.  Per EMS was hypoglycemic on arrival and hypotensive and was given D10 and 500 mL NS.  Denies any abdominal surgeries.       Home Medications Prior to Admission medications   Medication Sig Start Date End Date Taking? Authorizing Provider  acetaminophen-codeine (TYLENOL #3) 300-30 MG tablet Take 1 tablet by mouth every 6 (six) hours as needed for moderate pain. 08/27/20   Tommi Rumps, PA-C  albuterol (PROVENTIL HFA;VENTOLIN HFA) 108 (90 BASE) MCG/ACT inhaler Inhale 1 puff into the lungs every 6 (six) hours as needed for wheezing or shortness of breath.     [provider]  amLODipine (NORVASC) 10 MG tablet Take 10 mg by mouth daily.    [provider]  budesonide-formoterol (SYMBICORT) 160-4.5 MCG/ACT inhaler Inhale 2 puffs into the lungs 2 (two) times daily.    [provider]  carvedilol (COREG) 12.5 MG tablet Take 12.5 mg by mouth 2 (two) times daily with a meal.    [provider]  diclofenac Sodium (VOLTAREN) 1 % GEL Apply 2 g topically 4 (four) times daily as needed (pain).    [provider]  gabapentin (NEURONTIN) 400 MG capsule Take 800 mg by mouth every 8 (eight) hours.     [provider]  mirtazapine (REMERON) 30 MG tablet Take 30 mg by mouth at bedtime.    [provider]  predniSONE (DELTASONE) 10 MG  tablet Take 3 tablets once a day for 4 days 08/27/20   Tommi Rumps, PA-C  simvastatin (ZOCOR) 40 MG tablet Take 20 mg by mouth daily.     [provider]  traZODone (DESYREL) 100 MG tablet Take 200 mg by mouth at bedtime.     [provider]  triprolidine-pseudoephedrine (APRODINE) 2.5-60 MG TABS tablet Take 1 tablet by mouth every 6 (six) hours as needed for allergies.    [provider]      Allergies    Lisinopril    Review of Systems   Review of Systems  Physical Exam Updated Vital Signs BP (!) 156/80 (BP Location: Right Arm)   Pulse 68   Temp (!) 97.3 F (36.3 C) (Oral)   Resp 17   Ht 5\' 6"  (1.676 m)   Wt 52.2 kg   SpO2 99%   BMI 18.57 kg/m  Physical Exam Vitals and nursing note reviewed.  Constitutional:      General: He is not in acute distress.    Appearance: He is well-developed.     Comments: Holding emesis basin  HENT:     Head: Normocephalic and atraumatic.     Right Ear: External ear normal.     Left Ear: External ear normal.     Nose: Nose normal.  Eyes:     Extraocular Movements: Extraocular movements intact.     Conjunctiva/sclera: Conjunctivae normal.  Pupils: Pupils are equal, round, and reactive to light.  Cardiovascular:     Rate and Rhythm: Normal rate and regular rhythm.     Heart sounds: Normal heart sounds.  Pulmonary:     Effort: Pulmonary effort is normal. No respiratory distress.     Breath sounds: Normal breath sounds.  Abdominal:     General: There is no distension.     Palpations: Abdomen is soft. There is no mass.     Tenderness: There is abdominal tenderness (Diffuse). There is no guarding.  Musculoskeletal:     Cervical back: Normal range of motion and neck supple.     Right lower leg: No edema.     Left lower leg: No edema.  Skin:    General: Skin is warm and dry.  Neurological:     Mental Status: He is alert. Mental status is at baseline.  Psychiatric:        Mood and Affect: Mood normal.         Behavior: Behavior normal.     ED Results / Procedures / Treatments   Labs (all labs ordered are listed, but only abnormal results are displayed) Labs Reviewed  COMPREHENSIVE METABOLIC PANEL - Abnormal; Notable for the following components:      Result Value   CO2 18 (*)    BUN 39 (*)    Creatinine, Ser 1.86 (*)    Total Bilirubin 1.4 (*)    GFR, Estimated 37 (*)    Anion gap 17 (*)    All other components within normal limits  TROPONIN I (HIGH SENSITIVITY) - Abnormal; Notable for the following components:   Troponin I (High Sensitivity) 22 (*)    All other components within normal limits  TROPONIN I (HIGH SENSITIVITY) - Abnormal; Notable for the following components:   Troponin I (High Sensitivity) 20 (*)    All other components within normal limits  LIPASE, BLOOD  CBC WITH DIFFERENTIAL/PLATELET  MAGNESIUM  URINALYSIS, ROUTINE W REFLEX MICROSCOPIC  CBG MONITORING, ED    EKG EKG Interpretation Date/Time:  Friday March 03 2023 16:22:39 EDT Ventricular Rate:  66 PR Interval:  153 QRS Duration:  97 QT Interval:  442 QTC Calculation: 464 R Axis:   92  Text Interpretation: Sinus rhythm Anteroseptal infarct, age indeterminate Confirmed by Vonita Moss [phone]) on 03/03/2023 4:28:57 PM  Radiology CT ABDOMEN PELVIS W CONTRAST  Result Date: 03/03/2023 CLINICAL DATA:  Nausea/vomiting EXAM: CT ABDOMEN AND PELVIS WITH CONTRAST TECHNIQUE: Multidetector CT imaging of the abdomen and pelvis was performed using the standard protocol following bolus administration of intravenous contrast. RADIATION DOSE REDUCTION: This exam was performed according to the departmental dose-optimization program which includes automated exposure control, adjustment of the mA and/or kV according to patient size and/or use of iterative reconstruction technique. 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  Hepatobiliary: Liver is notable for a tiny subcentimeter cysts, benign. Suspected gallbladder sludge (series 2/image 26), with associated plantar changes. No intrahepatic or extrahepatic ductal dilatation. Pancreas: Within normal limits. Spleen: Lobulated spleen. Adrenals/Urinary Tract: Right adrenal gland is within normal limits. 16 mm left adrenal nodule (series 2/image 6), measuring 20-25 HUs following contrast administration, technically indeterminate but similar to 2018 (measuring 15 mm), compatible with a benign adrenal adenoma. Bilateral renal cortical scarring with scattered simple cysts, measuring up to 2.4 cm in the medial right lower kidney (series 2/image 25), benign (Bosniak I). No follow-up is recommended.  No renal calculi or hydronephrosis. Bladder is normal limits. Stomach/Bowel: Stomach is within normal limits. No evidence of bowel obstruction. Appendix is not discretely visualized. No colonic wall thickening or inflammatory changes. Vascular/Lymphatic: No evidence of abdominal aortic aneurysm. Atherosclerotic calcifications of the abdominal aorta and branch vessels, although vessels remain patent. No suspicious abdominopelvic lymphadenopathy. Reproductive: Prostate is unremarkable. Other: No abdominopelvic ascites. Musculoskeletal: Degenerative changes of the lower lumbar spine. IMPRESSION: Gallbladder sludge, without associated inflammatory changes to suggest acute cholecystitis. Additional ancillary findings as above. Electronically Signed   By: Charline Bills M.D.   On: 03/03/2023 19:38    Procedures Procedures    Medications Ordered in ED Medications  metoCLOPramide (REGLAN) injection 10 mg (10 mg Intravenous Not Given 03/03/23 2151)  metoCLOPramide (REGLAN) injection 10 mg (10 mg Intravenous Given 03/03/23 1643)  diphenhydrAMINE (BENADRYL) injection 12.5 mg (12.5 mg Intravenous Given 03/03/23 1643)  iohexol (OMNIPAQUE) 350 MG/ML injection 60 mL (60 mLs Intravenous Contrast Given  03/03/23 1751)  lactated ringers bolus 1,000 mL (0 mLs Intravenous Stopped 03/03/23 2109)    ED Course/ Medical Decision Making/ A&P Clinical Course as of 03/03/23 2220  Fri Mar 03, 2023  1716 Patient's sister Junius Finner at the bedside.  Reports that he occasionally will have nausea and vomiting bouts like this that spontaneously resolved.  Gets his care through the [place] and they are not sure what causes these episodes. [RP]  1747 Creatinine(!): 1.86 Baseline 1.2 [RP]  2212 Dr Ned Card from internal medicine to admit the patient.  [RP]    Clinical Course User Index [RP] Rondel Baton, MD                                 Medical Decision Making Amount and/or Complexity of Data Reviewed Labs: ordered. Decision-making details documented in ED Course. Radiology: ordered.  Risk Prescription drug management. Decision regarding hospitalization.   EZERIAH LUTY is a 77 y.o. male with comorbidities that complicate the patient evaluation including hypertension, hyperlipidemia, atrial fibrillation on Eliquis, CHF, and COPD who presents to the emergency department with nausea and vomiting.    Initial Ddx:  Gastroenteritis, cyclical vomiting syndrome, bowel obstruction, ileus, AKI, electrolyte abnormality  MDM/Course:  Patient presented to the emergency department with nausea and vomiting for a week.  On exam did have some mild tenderness to palpation.  Underwent CT scan does not show any acute findings.  Labs did show that he had an AKI.  Given IV fluids and attempted p.o. challenge but though the patient's nausea and vomiting had improved he was still unable to tolerate p.o.  Was admitted to internal medicine for further management.   This patient presents to the ED for concern of complaints listed in HPI, this involves an extensive number of treatment options, and is a complaint that carries with it a high risk of complications and morbidity. Disposition including potential need for  admission considered.   Dispo: Admit to Floor  Additional history obtained from daughter Records reviewed Outpatient Clinic Notes The following labs were independently interpreted: Chemistry and show AKI I independently reviewed the following imaging with scope of interpretation limited to determining acute life threatening conditions related to emergency care: CT Abdomen/Pelvis and agree with the radiologist interpretation with the following exceptions: none I personally reviewed and interpreted the pt's EKG: see above for interpretation  I have reviewed the patients home medications and made adjustments as needed Consults:  Internal medicine  Social Determinants of health:  Elderly  Portions of this note were generated with Scientist, clinical (histocompatibility and immunogenetics). Dictation errors may occur despite best attempts at proofreading.           Final Clinical Impression(s) / ED Diagnoses Final diagnoses:  Nausea and vomiting, unspecified vomiting type  Dehydration  AKI (acute kidney injury) [facility])    Rx / DC Orders ED Discharge Orders     None         Rondel Baton, MD 03/03/23 2220

## 2023-03-03 NOTE — Hospital Course (Addendum)
Ross Fisher is a 77 year old gentleman with pertinent medical history of COPD, hypertension, hyperlipidemia, A-fib on Eliquis, heart failure with mildly reduced ejection fraction, and chronic nausea and vomiting who presented to the emergency department after 1 week of nausea vomiting, inability to tolerate p.o. for 3 days, and AKI.  He was given IV fluid rehydration, as well as a dose of IV Reglan.  His symptoms gradually improved and he was able to eat breakfast in the morning without any additional nausea or vomiting.  This has been an ongoing issue for him, and is unclear why he is having these issues.  Workup here was relatively unremarkable including CT abdomen pelvis, CMP, and lipase. [facility] and got [facility] many years ago per the patient, but has not followed up with a physician regarding this.  Usually these episodes are self-limited and the patient is able to tolerate them without having to present for medical attention.  He would benefit from seeing his PCP as well as a GI doctor at the [facility] for additional workup.

## 2023-03-03 NOTE — ED Triage Notes (Signed)
PT BIB EMS from home after feeling ill with N/V for 1 week. Family called 911. Pt denies any PO intake solid and fluids, is not a diabetic. Upon EMS arrival, BP was 80/50 & BG 65. EMS gave 500 NS & 8g D10 resulting in BG 160 then 121 upon ED arrival. BP improved to 120/76. Per EMS patient is at baseline, but refused to answer orientation questions stating "These are silly questions and I will not be answering them".

## 2023-03-04 DIAGNOSIS — R112 Nausea with vomiting, unspecified: Secondary | ICD-10-CM | POA: Diagnosis not present

## 2023-03-04 DIAGNOSIS — N179 Acute kidney failure, unspecified: Secondary | ICD-10-CM | POA: Diagnosis not present

## 2023-03-04 LAB — CBC
HCT: 36 % — ABNORMAL LOW (ref 39.0–52.0)
Hemoglobin: 12.4 g/dL — ABNORMAL LOW (ref 13.0–17.0)
MCH: 33.3 pg (ref 26.0–34.0)
MCHC: 34.4 g/dL (ref 30.0–36.0)
MCV: 96.8 fL (ref 80.0–100.0)
Platelets: 202 10*3/uL (ref 150–400)
RBC: 3.72 MIL/uL — ABNORMAL LOW (ref 4.22–5.81)
RDW: 13.1 % (ref 11.5–15.5)
WBC: 7.8 10*3/uL (ref 4.0–10.5)
nRBC: 0 % (ref 0.0–0.2)

## 2023-03-04 LAB — BASIC METABOLIC PANEL
Anion gap: 8 (ref 5–15)
BUN: 31 mg/dL — ABNORMAL HIGH (ref 8–23)
CO2: 21 mmol/L — ABNORMAL LOW (ref 22–32)
Calcium: 8.6 mg/dL — ABNORMAL LOW (ref 8.9–10.3)
Chloride: 103 mmol/L (ref 98–111)
Creatinine, Ser: 1.45 mg/dL — ABNORMAL HIGH (ref 0.61–1.24)
GFR, Estimated: 50 mL/min — ABNORMAL LOW (ref >60)
Glucose, Bld: 113 mg/dL — ABNORMAL HIGH (ref 70–99)
Potassium: 4.1 mmol/L (ref 3.5–5.1)
Sodium: 132 mmol/L — ABNORMAL LOW (ref 135–145)

## 2023-03-04 LAB — URINALYSIS, ROUTINE W REFLEX MICROSCOPIC
Bilirubin Urine: NEGATIVE
Glucose, UA: NEGATIVE mg/dL
Hgb urine dipstick: NEGATIVE
Ketones, ur: 20 mg/dL — AB
Leukocytes,Ua: NEGATIVE
Nitrite: NEGATIVE
Protein, ur: NEGATIVE mg/dL
Specific Gravity, Urine: 1.034 — ABNORMAL HIGH (ref 1.005–1.030)
pH: 5 (ref 5.0–8.0)

## 2023-03-04 LAB — HEMOGLOBIN A1C
Hgb A1c MFr Bld: 5.6 % (ref 4.8–5.6)
Mean Plasma Glucose: 114.02 mg/dL

## 2023-03-04 MED ORDER — METOCLOPRAMIDE HCL 5 MG PO TABS: 5.0000 mg | ORAL_TABLET | Freq: Every day | ORAL | 0 refills | Status: AC | PRN

## 2023-03-04 NOTE — Discharge Instructions (Addendum)
You were hospitalized for Nausea and Vomiting. Thank you for allowing [place] to be part of your care.    You would likely benefit from seeing a gastrointestinal doctor at the [place] and having a procedure where they look down your throat with a camera done.  Please follow up with your regular primary care doctor at the [facility] as well.   Please talk to your doctor about difficulty urinating. There are many reasons for this, most commonly it is due to prostate enlargement.  Please note these changes made to your medications:  Please START taking:  Reglan (Metoclopramide) 5mg  - once a day as needed for nausea / vomiting   Please call our clinic if you have any questions or concerns, we may be able to help and keep you from a long and expensive emergency room wait. Our clinic and after hours phone number is [phone], the best time to call is Monday through Friday 9 am to 4 pm but there is always someone available 24/7 if you have an emergency. If you need medication refills please notify your pharmacy one week in advance and they will send [place] a request.

## 2023-03-04 NOTE — Progress Notes (Signed)
New Admission Note: 45m-20   Arrival Method: ED stretcher Mental Orientation: a/ox4 Telemetry: n/a Assessment:completed Skin: intact IV: left forearm Pain:n/a Tubes: n/a Safety Measures: non-skid socks, bed alarm Admission:  Orientation: Patient has been oriented to the room, unit and staff.  Family: n/a Belongings: at bedside  Orders have been reviewed and implemented. Will continue to monitor the patient. Call light has been placed within reach and bed alarm has been activated.   Fabian Sharp BSN, RN-BC Phone number: [phone]

## 2023-03-04 NOTE — Discharge Summary (Signed)
Name: Ross Fisher MRN: [id number] DOB: 05-16-1945 77 y.o. P[facility]: [facility]  Date of Admission: 03/03/2023  4:03 PM Date of Discharge:  03/04/2023 Attending Physician: Dr.  Lafonda Mosses  DISCHARGE DIAGNOSIS:  Primary Problem: AKI (acute kidney injury) [facility])   Hospital Problems: Principal Problem:   AKI (acute kidney injury) (HCC) Active Problems:   Nausea and vomiting    DISCHARGE MEDICATIONS:   Allergies as of 03/04/2023       Reactions   Gabapentin Diarrhea   Hydrochlorothiazide Other (See Comments)   unsure   Lisinopril    Other Reaction(s): Serum creatinine raised        Medication List     STOP taking these medications    acetaminophen-codeine 300-30 MG tablet Commonly known as: TYLENOL #3   predniSONE 10 MG tablet Commonly known as: DELTASONE   triprolidine-pseudoephedrine 2.5-60 MG Tabs tablet Commonly known as: APRODINE       TAKE these medications    albuterol 108 (90 Base) MCG/ACT inhaler Commonly known as: VENTOLIN HFA Inhale 1 puff into the lungs every 6 (six) hours as needed for wheezing or shortness of breath.   amLODipine 10 MG tablet Commonly known as: NORVASC Take 1 tablet by mouth daily.   apixaban 5 MG Tabs tablet Commonly known as: ELIQUIS Take 5 mg by mouth 2 (two) times daily.   budesonide-formoterol 160-4.5 MCG/ACT inhaler Commonly known as: SYMBICORT Inhale 2 puffs into the lungs 2 (two) times daily.   carvedilol 12.5 MG tablet Commonly known as: COREG Take 12.5 mg by mouth 2 (two) times daily with a meal.   diclofenac Sodium 1 % Gel Commonly known as: VOLTAREN Apply 2 g topically 4 (four) times daily as needed (pain).   fluticasone-salmeterol 250-50 MCG/ACT Aepb Commonly known as: ADVAIR Inhale 1 puff into the lungs in the morning and at bedtime.   methocarbamol 750 MG tablet Commonly known as: ROBAXIN Take 750 mg by mouth every 8 (eight) hours as needed for muscle spasms.   metoCLOPramide 5 MG  tablet Commonly known as: Reglan Take 1 tablet (5 mg total) by mouth daily as needed for nausea.   mirtazapine 30 MG tablet Commonly known as: REMERON Take 30 mg by mouth at bedtime.   simvastatin 40 MG tablet Commonly known as: ZOCOR Take 20 mg by mouth daily.   traZODone 100 MG tablet Commonly known as: DESYREL Take 200 mg by mouth at bedtime.        DISPOSITION AND FOLLOW-UP:  Ross Fisher was discharged from [facility] in Stable condition. At the hospital follow up visit please address:  Follow-up Recommendations: Consults: Gastroenterology Labs: Basic Metabolic Profile Studies: Consider gastric emptying study the patient would likely have to be off opioids. Medications: Given some doses metoclopramide as needed.  Follow-up Appointments:  Follow-up Information     [facility]   Specialty: General Practice Contact information: [address] [place] [address] [phone]                 HOSPITAL COURSE:  Patient Summary: Ross Fisher is a 77 year old gentleman with pertinent medical history of COPD, hypertension, hyperlipidemia, A-fib on Eliquis, heart failure with mildly reduced ejection fraction, and chronic nausea and vomiting who presented to the emergency department after 1 week of nausea vomiting, inability to tolerate p.o. for 3 days, and AKI.  He was given IV fluid rehydration, as well as a dose of IV Reglan.  His symptoms gradually  improved and he was able to eat breakfast in the morning without any additional nausea or vomiting.  This has been an ongoing issue for him, and is unclear why he is having these issues.  Workup here was relatively unremarkable including CT abdomen pelvis, CMP, and lipase. [facility] and got a E[facility] done many years ago per the patient, but has not followed up with a physician regarding this.  Usually these episodes are self-limited and the patient is able to  tolerate them without having to present for medical attention.  He would benefit from seeing his PCP as well as a GI doctor at the [facility] for additional workup.    DISCHARGE INSTRUCTIONS:   Discharge Instructions     Call MD for:  persistant nausea and vomiting   Complete by: As directed    Diet - low sodium heart healthy   Complete by: As directed    Increase activity slowly   Complete by: As directed    Increase activity slowly   Complete by: As directed        SUBJECTIVE:  Today on rounds, patient feeling better.  Would like to attempt p.o.  He denies dysphagia, dyspepsia, presyncope, dizziness, lightheadedness, chest pain, acid reflux-like symptoms, hematochezia, hematemesis, or melena.  He does not endorse much early satiety or bloating.  It is unclear to him why and when the symptoms occur and if anything makes it worse.  Discharge Vitals:   BP (!) 143/63 (BP Location: Right Arm)   Pulse 65   Temp 98.4 F (36.9 C) (Oral)   Resp 14   Ht 5\' 6"  (1.676 m)   Wt 52.2 kg   SpO2 100%   BMI 18.57 kg/m   OBJECTIVE:  Physical Exam Constitutional:      Appearance: Normal appearance.  HENT:     Head: Normocephalic and atraumatic.  Cardiovascular:     Rate and Rhythm: Normal rate and regular rhythm.  Pulmonary:     Effort: Pulmonary effort is normal.     Breath sounds: Normal breath sounds. No stridor. No wheezing, rhonchi or rales.  Abdominal:     General: Abdomen is flat. Bowel sounds are normal. There is no distension.     Tenderness: There is no abdominal tenderness.  Skin:    General: Skin is warm and dry.  Neurological:     Mental Status: He is alert.  Psychiatric:        Mood and Affect: Mood normal.      Pertinent Labs, Studies, and Procedures:     Latest Ref Rng & Units 03/04/2023    4:21 AM 03/03/2023    4:21 PM 01/23/2020    3:58 AM  CBC  WBC 4.0 - 10.5 K/uL 7.8  9.1  13.2   Hemoglobin 13.0 - 17.0 g/dL 09.8  11.9  14.7   Hematocrit 39.0 - 52.0 % 36.0   42.0  39.1   Platelets 150 - 400 K/uL 202  261  258        Latest Ref Rng & Units 03/04/2023    4:21 AM 03/03/2023    4:21 PM 01/23/2020    3:58 AM  CMP  Glucose 70 - 99 mg/dL 829  99  562   BUN 8 - 23 mg/dL 31  39  30   Creatinine 0.61 - 1.24 mg/dL 1.30  8.65  7.84   Sodium 135 - 145 mmol/L 132  137  141   Potassium 3.5 -  5.1 mmol/L 4.1  4.5  3.6   Chloride 98 - 111 mmol/L 103  102  102   CO2 22 - 32 mmol/L 21  18  29    Calcium 8.9 - 10.3 mg/dL 8.6  9.2  8.3   Total Protein 6.5 - 8.1 g/dL  6.9  6.6   Total Bilirubin 0.3 - 1.2 mg/dL  1.4  0.8   Alkaline Phos 38 - 126 U/L  50  55   AST 15 - 41 U/L  20  41   ALT 0 - 44 U/L  16  49     CT ABDOMEN PELVIS W CONTRAST  Result Date: 03/03/2023 CLINICAL DATA:  Nausea/vomiting EXAM: CT ABDOMEN AND PELVIS WITH CONTRAST TECHNIQUE: Multidetector CT imaging of the abdomen and pelvis was performed using the standard protocol following bolus administration of intravenous contrast. RADIATION DOSE REDUCTION: This exam was performed according to the departmental dose-optimization program which includes automated exposure control, adjustment of the mA and/or kV according to patient size and/or use of iterative reconstruction technique. 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 Hepatobiliary: Liver is notable for a tiny subcentimeter cysts, benign. Suspected gallbladder sludge (series 2/image 26), with associated plantar changes. No intrahepatic or extrahepatic ductal dilatation. Pancreas: Within normal limits. Spleen: Lobulated spleen. Adrenals/Urinary Tract: Right adrenal gland is within normal limits. 16 mm left adrenal nodule (series 2/image 6), measuring 20-25 HUs following contrast administration, technically indeterminate but similar to 2018 (measuring 15 mm), compatible with a benign adrenal adenoma. Bilateral renal cortical scarring with scattered simple cysts,  measuring up to 2.4 cm in the medial right lower kidney (series 2/image 25), benign (Bosniak I). No follow-up is recommended. No renal calculi or hydronephrosis. Bladder is normal limits. Stomach/Bowel: Stomach is within normal limits. No evidence of bowel obstruction. Appendix is not discretely visualized. No colonic wall thickening or inflammatory changes. Vascular/Lymphatic: No evidence of abdominal aortic aneurysm. Atherosclerotic calcifications of the abdominal aorta and branch vessels, although vessels remain patent. No suspicious abdominopelvic lymphadenopathy. Reproductive: Prostate is unremarkable. Other: No abdominopelvic ascites. Musculoskeletal: Degenerative changes of the lower lumbar spine. IMPRESSION: Gallbladder sludge, without associated inflammatory changes to suggest acute cholecystitis. Additional ancillary findings as above. Electronically Signed   By: Charline Bills M.D.   On: 03/03/2023 19:38     Signed: Lovie Macadamia MD Internal Medicine Resident, PGY-1 [facility]  Pager: [phone] 1:59 PM, 03/04/2023

## 2023-03-04 NOTE — Plan of Care (Signed)

## 2023-03-04 NOTE — H&P (Addendum)
Date: 03/04/2023               Patient Name:  Ross Fisher MRN: [id number]  DOB: Aug 16, 1945 Age / Sex: 77 y.o., male   PCP: [facility]         Medical Service: Internal Medicine Teaching Service         Attending Physician: Dr. Mercie Eon, MD      First Contact: Dr. Lovie Macadamia, MD [phone]    Second Contact: Dr. Marrianne Mood, MD Pager [phone]         After Hours (After 5p/  First Contact Pager: [phone]  weekends / holidays): Second Contact Pager: [phone]   SUBJECTIVE   Chief Complaint: Nausea and vomiting   History of Present Illness:  Ross Fisher is a 77 year old man with a history of hypertension, hyperlipidemia, atrial fibrillation on Eliquis, HFpEF ( EF 45% in 2022) COPD who presents to the ED with 1 week of nausea and vomiting.  Of note, patient is hard of hearing. Majority of the history was elicited from the patient and his sister Ross Fisher was at the bedside.  He has a chronic history of intermittent nausea/vomiting that has been ongoing for years. Has gone to the [place] but has not receive a formal diagnosis for this nausea and vomiting. Last time this happened, he waited it out at home, started to feel better on its own after a few days. This episode of n/v was however different, in that it didn't get better on it's own. This flare started about 1 week, he said he had >10 episodes of vomiting per day ago. This episode of nausea and vomiting has caused him to feel tired and sleepy.  His nausea and vomiting is not associated with any foods.  He drinks a lot of water to stay hydrated.  Denies abdominal pain, diarrhea, melena or hematochezia. His last bowel movement was a few days ago, which is normal in his case. He denies sick contacts, chills, headaches, chest pain, sob, or urinary symptoms.  He has been experiencing chronic unintentional weight loss for years now.  He is not able to quantify this, but guesses that he has lost about 10 lbs in the past  year. His sister also collaborates the story about a chronic weight loss over the last few years, but also unable to quantify how much.  Patient denies night sweats or swollen lymph nodes.  He has a history of chronic pain, and he takes acetaminophen-codeine four times a day for his history of chronic back pain.  His PCP at the [facility] prescribes this.  Per EMS, he was hypoglycemic on arrival and hypotensive, was given D10 and 500 mL NS.  ED Course: At ED, he was also treated with IV fluids and Reglan for nausea.  He failed p.o. challenge, and was thus admitted to internal medicine for further management.  Past Medical History  Past Medical History:  Diagnosis Date   Back pain    COPD (chronic obstructive pulmonary disease) (HCC)    High cholesterol    Hypercholesteremia    Hypertension      Meds:   Current Meds  Medication Sig   acetaminophen-codeine (TYLENOL #3) 300-30 MG tablet Take 1 tablet by mouth every 6 (six) hours as needed for moderate pain.   albuterol (PROVENTIL HFA;VENTOLIN HFA) 108 (90 BASE) MCG/ACT inhaler Inhale 1 puff into the lungs every 6 (six) hours as needed for wheezing or shortness of breath.    amLODipine (NORVASC)  10 MG tablet Take 1 tablet by mouth daily.   apixaban (ELIQUIS) 5 MG TABS tablet Take 5 mg by mouth 2 (two) times daily.   fluticasone-salmeterol (ADVAIR) 250-50 MCG/ACT AEPB Inhale 1 puff into the lungs in the morning and at bedtime.   methocarbamol (ROBAXIN) 750 MG tablet Take 750 mg by mouth every 8 (eight) hours as needed for muscle spasms.   simvastatin (ZOCOR) 40 MG tablet Take 20 mg by mouth daily.     Past Surgical History  Past Surgical History:  Procedure Laterality Date   CATARACT EXTRACTION     ROTATOR CUFF REPAIR     bilat    Social:  Lives with his 44 year old mother in [place], but his sister also lives across the street from him.   Support: Sister and mother  Level of Function: Independent of ADL's and iADL's. He manages  his own medicines. PCP: [facility] Substances: Smokes 1 pack of cigarette x 50 years. No alcohol, marijuana or cocaine use.  Allergies: Allergies as of 03/03/2023 - Review Complete 09/03/2021  Allergen Reaction Noted   Gabapentin Diarrhea 03/03/2023   Hydrochlorothiazide Other (See Comments) 03/03/2023   Lisinopril  08/24/2009    Review of Systems: A complete ROS was negative except as per HPI.   OBJECTIVE:   Physical Exam: Blood pressure (!) 152/86, pulse 65, temperature 98 F (36.7 C), temperature source Oral, resp. rate 17, height 5\' 6"  (1.676 m), weight 52.2 kg, SpO2 97%.   Constitutional:chronically ill appearing, not in acute distress. Hard of hearing at baseline.  HENT: Normocephalic, atraumatic,  Eyes: Sclera non-icteric, PERRL, EOM intact Cardio:Regular rate and rhythm. No murmurs, rubs, or gallops. 2+ bilateral dorsalis pedis pulses. Pulm:Clear to auscultation bilaterally. Normal work of breathing on room air. Abdomen: Soft, non-tender, non-distended, positive bowel sounds. ZOX:WRUEAVWU for extremity edema. Skin:Warm and dry. Neuro: Alert and oriented to self, place, year, but not month (says April). No focal weakness.  Psych: Mood and affect appropriate.  Labs: CBC: Normal. BMP:  Cr 1.86  baseline 1.2. CT abdomen and pelvis was normal.  Evidence of gallbladder sludge without associated inflammatory changes to suggest acute cholecystitis.  CBC    Component Value Date/Time   WBC 9.1 03/03/2023 1621   RBC 4.23 03/03/2023 1621   HGB 14.0 03/03/2023 1621   HCT 42.0 03/03/2023 1621   PLT 261 03/03/2023 1621   MCV 99.3 03/03/2023 1621   MCH 33.1 03/03/2023 1621   MCHC 33.3 03/03/2023 1621   RDW 13.4 03/03/2023 1621   LYMPHSABS 1.4 03/03/2023 1621   MONOABS 0.7 03/03/2023 1621   EOSABS 0.1 03/03/2023 1621   BASOSABS 0.1 03/03/2023 1621     CMP     Component Value Date/Time   NA 137 03/03/2023 1621   K 4.5 03/03/2023 1621   CL 102  03/03/2023 1621   CO2 18 (L) 03/03/2023 1621   GLUCOSE 99 03/03/2023 1621   BUN 39 (H) 03/03/2023 1621   CREATININE 1.86 (H) 03/03/2023 1621   CALCIUM 9.2 03/03/2023 1621   PROT 6.9 03/03/2023 1621   ALBUMIN 4.0 03/03/2023 1621   AST 20 03/03/2023 1621   ALT 16 03/03/2023 1621   ALKPHOS 50 03/03/2023 1621   BILITOT 1.4 (H) 03/03/2023 1621   GFRNONAA 37 (L) 03/03/2023 1621   GFRAA >60 01/23/2020 0358    Imaging:  CT ABDOMEN PELVIS W CONTRAST  Result Date: 03/03/2023 CLINICAL DATA:  Nausea/vomiting EXAM: CT ABDOMEN AND PELVIS WITH CONTRAST TECHNIQUE: Multidetector CT imaging of the  abdomen and pelvis was performed using the standard protocol following bolus administration of intravenous contrast. RADIATION DOSE REDUCTION: This exam was performed according to the departmental dose-optimization program which includes automated exposure control, adjustment of the mA and/or kV according to patient size and/or use of iterative reconstruction technique. 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 Hepatobiliary: Liver is notable for a tiny subcentimeter cysts, benign. Suspected gallbladder sludge (series 2/image 26), with associated plantar changes. No intrahepatic or extrahepatic ductal dilatation. Pancreas: Within normal limits. Spleen: Lobulated spleen. Adrenals/Urinary Tract: Right adrenal gland is within normal limits. 16 mm left adrenal nodule (series 2/image 6), measuring 20-25 HUs following contrast administration, technically indeterminate but similar to 2018 (measuring 15 mm), compatible with a benign adrenal adenoma. Bilateral renal cortical scarring with scattered simple cysts, measuring up to 2.4 cm in the medial right lower kidney (series 2/image 25), benign (Bosniak I). No follow-up is recommended. No renal calculi or hydronephrosis. Bladder is normal limits. Stomach/Bowel: Stomach is within normal  limits. No evidence of bowel obstruction. Appendix is not discretely visualized. No colonic wall thickening or inflammatory changes. Vascular/Lymphatic: No evidence of abdominal aortic aneurysm. Atherosclerotic calcifications of the abdominal aorta and branch vessels, although vessels remain patent. No suspicious abdominopelvic lymphadenopathy. Reproductive: Prostate is unremarkable. Other: No abdominopelvic ascites. Musculoskeletal: Degenerative changes of the lower lumbar spine. IMPRESSION: Gallbladder sludge, without associated inflammatory changes to suggest acute cholecystitis. Additional ancillary findings as above. Electronically Signed   By: Charline Bills M.D.   On: 03/03/2023 19:38     EKG: personally reviewed my interpretation is NSR, W/o  ischemic changes.    ASSESSMENT & PLAN:   Assessment & Plan by Problem: Principal Problem:   AKI (acute kidney injury) [facility])  Mr. Tiso is a 77 year old man with a history of hypertension, hyperlipidemia, atrial fibrillation on Eliquis, HFpEF (EF 45%), COPD who presents to the ED with 1 week of nausea and vomiting, and is admitted for AKI and further work up for the chronic intermittent nausea and vomiting.    # Acute kidney Injury Cr 1.86 on presentation. Baseline 1.20.  BUN/Cr >21, consistent with a prerenal etiology.  This is to be expected given the ongoing vomiting and poor oral intake. He is s/p 1L of IV fluids.   - Repeat bolus of 1L of fluids.  - Maintenance fluids 100cc/hr D5 1/2 NS for 8 hrs.  # Nausea/ Vomiting and Unintentional weight loss  Chronic history of intermittent nausea and vomiting without a clear etiology. Differential diagnosis of intermittent chronic n/v  includes gastroparesis vs. functional dyspepsia vs cyclical vomiting. Patient has no history of diabetes mellitus, but he takes tylenol 3 four times a day for his back pain. Opiates slow the bowels, and can put him at risk for gastroparesis. He would need to be off  opioid for at least 48 - 72 hours before gastric emptying study.  Given patient's age >44, with ongoing weight loss, he requires further investigation. Upon further chart review, he has not been evaluated with an EGD.   - HbA1c - H. pylori testing. - IV compazine. - Consult GI for possible EGD.  - Could consider Reglan if Compazine is not controlling nausea and vomiting. - Consider weaning off Tylenol 3 if he has to undergo gastric emptying study outpatient.  # Paroxysmal atrial fibrillation # HFpEF Diagnosed 2021. Followed by cardiology. s/p PVI 12/2020. No acute sx today. On Apixaban for chronic anticoagulation.  TTE 05/26/20 showed an EF 45% and left ventricle with mild global hypokinesis.  - Continue Eliquis  - Continue Coreg 25 mg twice a day.  # Essential hypertension Bp 152/75. He is on amlodipine 10 mg and carvedilol 25 mg twice daily.  - Resumed home meds.   # Chronic Obstructive Pulmonary Disease:  PFT, 05/13/2022: FEV1 57% pred. Stable, denies worsening cough. He is on fluticasone/salmeterol BID and alb prn. He has been using NRT to help cut the smoking.  - resumed home meds.  # Substance use disorder  He has about 50-pack-year history of cigarette smoking. Currently smokes 1 pack of cigarette. Not in any signs of withdrawal.  -Transdermal nicotine patch.   Chronic stable medical conditions.  # Hyperlipidemia - Atorvastatin 20 mg.   # Insomnia  -Trazodone 200 mg.   # Chronic back pain - Acetaminophen with codeine 30-300 mg q6hrs - Holding gabapentin due to his AKI. - Robaxin 750 mg    Diet:  Clear liquid diet VTE: DOAC IVF:  D5 1/2 NS ,100cc/hr Code: DNR/DNI  Dispo: Admit patient to Inpatient with expected length of stay greater than 2 midnights.  Signed:  Laretta Bolster, M.D.  Internal Medicine Resident, PGY-1 [facility]  Pager: [phone] 4:22 AM, 03/04/2023   **Please contact the on call pager after 5 pm and on  weekends at [phone].**

## 2023-03-04 NOTE — Progress Notes (Signed)
Patient seemed to have a difficult time urinating, he was able 320 ml out after trying for about 10 minutes. Bladder scanned him afterwards for 247 ml post void residual. Patient was also very reluctant with cooperating.   Garwin Brothers RN

## 2024-04-23 ENCOUNTER — Other Ambulatory Visit: Payer: Self-pay

## 2024-04-23 ENCOUNTER — Emergency Department
Admission: EM | Admit: 2024-04-23 | Discharge: 2024-04-23 | Disposition: A | Discharge status: Home / Self Care | Attending: Emergency Medicine | Admitting: Emergency Medicine

## 2024-04-23 ENCOUNTER — Emergency Department

## 2024-04-23 DIAGNOSIS — M25562 Pain in left knee: Secondary | ICD-10-CM | POA: Diagnosis present

## 2024-04-23 DIAGNOSIS — W1839XA Other fall on same level, initial encounter: Secondary | ICD-10-CM | POA: Insufficient documentation

## 2024-04-23 MED ORDER — OXYCODONE-ACETAMINOPHEN 5-325 MG PO TABS: 1.0000 | ORAL_TABLET | ORAL | 0 refills | Status: AC | PRN

## 2024-04-23 NOTE — ED Provider Notes (Signed)
 "  [facility] Provider Note   Event Date/Time   First MD Initiated Contact with Patient 04/23/24 [phone]     (approximate) History  Knee Injury  HPI Ross Fisher is a 78 y.o. male with stated past medical history of left knee fracture who presents complaining of left knee pain that has been persistent and unrelenting for the last 5 weeks after a mechanical fall.  Patient states that he landed on both of his knees however only the left 1 is continuing to be painful.  Patient does endorse shooting pains down the posterior aspect of his leg occasionally as well.  Patient denies any exacerbating or relieving factors for that shooting pain.  Patient does state that any ambulation worsens this pain at the knee.  Patient denies any significant swelling.  Patient has been using Tylenol  and muscle relaxers that he states have not done anything for his pain. ROS: Patient currently denies any vision changes, tinnitus, difficulty speaking, facial droop, sore throat, chest pain, shortness of breath, abdominal pain, nausea/vomiting/diarrhea, dysuria, or weakness/numbness in any extremity   Physical Exam  Triage Vital Signs: ED Triage Vitals  Encounter Vitals Group     BP 04/23/24 0736 (!) 178/81     Girls Systolic BP Percentile --      Girls Diastolic BP Percentile --      Boys Systolic BP Percentile --      Boys Diastolic BP Percentile --      Pulse Rate 04/23/24 0736 (!) 101     Resp 04/23/24 0736 18     Temp 04/23/24 0736 98.5 F (36.9 C)     Temp src --      SpO2 04/23/24 0736 98 %     Weight 04/23/24 0735 105 lb (47.6 kg)     Height 04/23/24 0735 5' 8 (1.727 m)     Head Circumference --      Peak Flow --      Pain Score 04/23/24 0735 7     Pain Loc --      Pain Education --      Exclude from Growth Chart --    Most recent vital signs: Vitals:   04/23/24 0736  BP: (!) 178/81  Pulse: (!) 101  Resp: 18  Temp: 98.5 F (36.9 C)  SpO2: 98%   General: Awake,  oriented x4. CV:  Good peripheral perfusion. Resp:  Normal effort. Abd:  No distention. Other:  Elderly well-developed, nourished Caucasian male resting comfortably in no acute distress ED Results / Procedures / Treatments  Labs (all labs ordered are listed, but only abnormal results are displayed) Labs Reviewed - No data to display RADIOLOGY ED MD interpretation: Complete x-rays of the left knee show no evidence of acute abnormalities - All radiology independently interpreted and agree with radiology assessment Official radiology report(s): DG Knee Complete 4 Views Left Result Date: 04/23/2024 CLINICAL DATA:  Fall.  Left knee pain and decreased range of motion. EXAM: LEFT KNEE - COMPLETE 4+ VIEW COMPARISON:  03/17/2016 FINDINGS: No evidence of fracture, dislocation, or joint effusion. No evidence of arthropathy or other focal bone abnormality. Generalized osteopenia noted. Severe peripheral vascular calcification also seen. IMPRESSION: No acute findings. Osteopenia and severe peripheral vascular calcification. Electronically Signed   By: Norleen DELENA Kil M.D.   On: 04/23/2024 09:17   PROCEDURES: Critical Care performed: No Procedures MEDICATIONS ORDERED IN ED: Medications - No data to display IMPRESSION / MDM / ASSESSMENT AND PLAN / ED COURSE  I reviewed the triage vital signs and the nursing notes.                             The patient is on the cardiac monitor to evaluate for evidence of arrhythmia and/or significant heart rate changes. Patient's presentation is most consistent with acute presentation with potential threat to life or bodily function. Patient is a 78 year old male with the above-stated past medical history presents for left knee pain that has been present over the last 5 weeks after a fall Given history, exam and workup I have low suspicion for fracture, dislocation, significant ligamentous injury, septic arthritis, gout flare, new autoimmune arthropathy, or gonococcal  arthropathy.  Interventions: Left knee x-ray shows no evidence of acute fracture or dislocation Disposition: Discharge home with strict return precautions and instructions for prompt primary care follow up in the next week.   FINAL CLINICAL IMPRESSION(S) / ED DIAGNOSES   Final diagnoses:  Acute pain of left knee   Rx / DC Orders   ED Discharge Orders          Ordered    oxyCODONE -acetaminophen  (PERCOCET) 5-325 MG tablet  Every 4 hours PRN        04/23/24 0928           Note:  This document was prepared using Dragon voice recognition software and may include unintentional dictation errors.   Jossie Artist POUR, MD 04/23/24 [phone]  "

## 2024-04-23 NOTE — ED Triage Notes (Signed)
 Pt comes with left knee pain. Pt states he fell about 5weeks ago. Pt states he fell onto both knees but left is bothering more.
